# Patient Record
Sex: Female | Born: 1985 | Race: White | Hispanic: No | Marital: Married | Smoking: Never smoker
Health system: Southern US, Community
[De-identification: ages and names within clinical notes are randomized; demographics above are authoritative.]

## PROBLEM LIST (undated history)

## (undated) ENCOUNTER — Inpatient Hospital Stay (HOSPITAL_COMMUNITY): Payer: Self-pay

## (undated) DIAGNOSIS — J45909 Unspecified asthma, uncomplicated: Secondary | ICD-10-CM

## (undated) DIAGNOSIS — R002 Palpitations: Secondary | ICD-10-CM

## (undated) DIAGNOSIS — J40 Bronchitis, not specified as acute or chronic: Secondary | ICD-10-CM

## (undated) DIAGNOSIS — R011 Cardiac murmur, unspecified: Secondary | ICD-10-CM

## (undated) DIAGNOSIS — Z8759 Personal history of other complications of pregnancy, childbirth and the puerperium: Secondary | ICD-10-CM

## (undated) DIAGNOSIS — Z8669 Personal history of other diseases of the nervous system and sense organs: Secondary | ICD-10-CM

## (undated) DIAGNOSIS — E785 Hyperlipidemia, unspecified: Secondary | ICD-10-CM

## (undated) DIAGNOSIS — T7840XA Allergy, unspecified, initial encounter: Secondary | ICD-10-CM

## (undated) DIAGNOSIS — O139 Gestational [pregnancy-induced] hypertension without significant proteinuria, unspecified trimester: Secondary | ICD-10-CM

## (undated) HISTORY — DX: Allergy, unspecified, initial encounter: T78.40XA

## (undated) HISTORY — PX: MOUTH SURGERY: SHX715

## (undated) HISTORY — DX: Personal history of other complications of pregnancy, childbirth and the puerperium: Z87.59

## (undated) HISTORY — DX: Cardiac murmur, unspecified: R01.1

## (undated) HISTORY — DX: Palpitations: R00.2

## (undated) HISTORY — DX: Unspecified asthma, uncomplicated: J45.909

## (undated) HISTORY — PX: OTHER SURGICAL HISTORY: SHX169

## (undated) HISTORY — DX: Personal history of other diseases of the nervous system and sense organs: Z86.69

## (undated) HISTORY — DX: Hyperlipidemia, unspecified: E78.5

---
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
## 2004-10-15 ENCOUNTER — Other Ambulatory Visit: Admission: RE | Admit: 2004-10-15 | Discharge: 2004-10-15 | Payer: Self-pay | Admitting: Obstetrics & Gynecology

## 2004-10-16 ENCOUNTER — Other Ambulatory Visit: Admission: RE | Admit: 2004-10-16 | Discharge: 2004-10-16 | Payer: Self-pay | Admitting: Obstetrics & Gynecology

## 2005-04-26 ENCOUNTER — Emergency Department (HOSPITAL_COMMUNITY): Admission: EM | Admit: 2005-04-26 | Discharge: 2005-04-26 | Payer: Self-pay | Admitting: Emergency Medicine

## 2007-12-01 ENCOUNTER — Ambulatory Visit: Payer: Self-pay | Admitting: Internal Medicine

## 2010-08-27 LAB — ANTIBODY SCREEN: Antibody Screen: NEGATIVE

## 2010-08-27 LAB — HEPATITIS B SURFACE ANTIGEN: Hepatitis B Surface Ag: NEGATIVE

## 2010-08-27 LAB — GC/CHLAMYDIA PROBE AMP, GENITAL
Chlamydia: NEGATIVE
Gonorrhea: NEGATIVE

## 2010-10-03 ENCOUNTER — Encounter: Payer: Self-pay | Admitting: *Deleted

## 2010-10-03 ENCOUNTER — Encounter: Payer: Self-pay | Admitting: Cardiology

## 2010-10-04 ENCOUNTER — Ambulatory Visit (INDEPENDENT_AMBULATORY_CARE_PROVIDER_SITE_OTHER): Payer: PRIVATE HEALTH INSURANCE | Admitting: Cardiology

## 2010-10-04 ENCOUNTER — Encounter: Payer: Self-pay | Admitting: Cardiology

## 2010-10-04 VITALS — BP 98/70 | HR 92 | Ht 62.0 in | Wt 152.0 lb

## 2010-10-04 DIAGNOSIS — R002 Palpitations: Secondary | ICD-10-CM | POA: Insufficient documentation

## 2010-10-04 NOTE — Progress Notes (Signed)
HPI Kristin Sellers is a delightful 25 year old married white female, who is in her first trimester of her first pregnancy, who was referred by Dr. Annamaria Helling for palpitations.  She has a spontaneously. They started about 6 weeks ago. She denies any other symptoms other than telling that her heart is racing. She is checked this on oximetry on her nursing floor. Her heart rate was running about 130.  She had a history of palpitations back in 2010. At that time she was evaluated by Dr. Kirke Corin who performed an echocardiogram which was normal and also a Holter monitor that showed sinus arrhythmia and sometimes a resting tachycardia. There was no ectopy.  He was placed on low-dose metoprolol which made her feel very tired, dropped her blood pressure but improved her palpitations.  Patient not made worse by exercise. She's had no presyncope or syncope. She has eliminated caffeine since becoming pregnant. She is not using over-the-counter decongestants or other supplements. She is on prenatal vitamins and Zofran.  EKG today shows normal sinus rhythm with a borderline short PR interval 120 ms. Past Medical History  Diagnosis Date  . Palpitations   . Hyperlipidemia     Past Surgical History  Procedure Date  . None     Family History  Problem Relation Age of Onset  . Coronary artery disease    . Hypertension    . Heart attack      History   Social History  . Marital Status: Single    Spouse Name: N/A    Number of Children: 1  . Years of Education: N/A   Occupational History  . RN    Social History Main Topics  . Smoking status: Never Smoker   . Smokeless tobacco: Not on file  . Alcohol Use: Yes     occasional glass of wine when not pregnant  . Drug Use: No  . Sexually Active: Not on file   Other Topics Concern  . Not on file   Social History Narrative  . No narrative on file    Allergies  Allergen Reactions  . Sulfa Antibiotics     Current Outpatient Prescriptions    Medication Sig Dispense Refill  . ondansetron (ZOFRAN) 4 MG tablet Take 4 mg by mouth every 8 (eight) hours as needed.        . Prenatal Vit-Fe Psac Cmplx-FA (PRENATAL MULTIVITAMIN) 60-1 MG tablet Take 1 tablet by mouth daily with breakfast.          ROS Negative other than HPI.   PE General Appearance: well developed, well nourished in no acute distress HEENT: symmetrical face, PERRLA, good dentition  Neck: no JVD, thyromegaly, or adenopathy, trachea midline Chest: symmetric without deformity Cardiac: PMI non-displaced, RRR, normal S1, S2, no gallop or murmur Lung: clear to ausculation and percussion Vascular: all pulses full without bruits  Abdominal: nondistended, nontender, good bowel sounds, no HSM, no bruits, gravida,Extremities: no cyanosis, clubbing or edema, no sign of DVT, no varicosities  Skin: normal color, no rashes Neuro: alert and oriented x 3, non-focal Pysch: normal affect Filed Vitals:   10/04/10 1522  BP: 98/70  Pulse: 92  Height: 5\' 2"  (1.575 m)  Weight: 152 lb (68.947 kg)    EKG  Labs and Studies Reviewed.   No results found for this basename: WBC, HGB, HCT, MCV, PLT      Chemistry   No results found for this basename: NA, K, CL, CO2, BUN, CREATININE, GLU   No results found for  this basename: CALCIUM, ALKPHOS, AST, ALT, BILITOT       No results found for this basename: CHOL   No results found for this basename: HDL   No results found for this basename: LDLCALC   No results found for this basename: TRIG   No results found for this basename: CHOLHDL   No results found for this basename: HGBA1C   No results found for this basename: ALT, AST, GGT, ALKPHOS, BILITOT   No results found for this basename: TSH

## 2010-10-04 NOTE — Patient Instructions (Signed)
Your physician recommends that you schedule a follow-up appointment as needed with Dr. Wall   

## 2011-02-18 NOTE — L&D Delivery Note (Signed)
Patient was C/C/+2 and pushed for 1hr 50 minutes with epidural.   NSVD  female infant, Apgars 9/9, weight 7#11.   The patient had an extensive 2nd degree laceration extending up the R labia repaired with 2-0 vicryl, sphincter intact. Fundus was firm. EBL was expected, approx 400. Placenta was delivered intact, sent to pathology given abx tx for suspected chorioamnionitis. Vagina was clear.  Baby was vigorous to bedside.  Philip Aspen

## 2011-02-20 ENCOUNTER — Inpatient Hospital Stay (HOSPITAL_COMMUNITY)
Admission: AD | Admit: 2011-02-20 | Discharge: 2011-02-20 | Disposition: A | Discharge status: Home / Self Care | Payer: PRIVATE HEALTH INSURANCE | Source: Ambulatory Visit | Attending: Obstetrics and Gynecology | Admitting: Obstetrics and Gynecology

## 2011-02-20 ENCOUNTER — Encounter (HOSPITAL_COMMUNITY): Payer: Self-pay | Admitting: *Deleted

## 2011-02-20 DIAGNOSIS — O479 False labor, unspecified: Secondary | ICD-10-CM

## 2011-02-20 DIAGNOSIS — O4700 False labor before 37 completed weeks of gestation, unspecified trimester: Secondary | ICD-10-CM | POA: Insufficient documentation

## 2011-02-20 LAB — URINALYSIS, ROUTINE W REFLEX MICROSCOPIC
Bilirubin Urine: NEGATIVE
Glucose, UA: NEGATIVE mg/dL
Hgb urine dipstick: NEGATIVE
Ketones, ur: NEGATIVE mg/dL
Leukocytes, UA: NEGATIVE
Nitrite: NEGATIVE
Protein, ur: NEGATIVE mg/dL
Specific Gravity, Urine: <1.005 — ABNORMAL LOW (ref 1.005–1.030)
Urobilinogen, UA: 0.2 mg/dL (ref 0.0–1.0)
pH: 6 (ref 5.0–8.0)

## 2011-02-20 MED ORDER — NIFEDIPINE 10 MG PO CAPS
10.0000 mg | ORAL_CAPSULE | Freq: Once | ORAL | Status: AC
  Administered 2011-02-20: 10 mg via ORAL
  Filled 2011-02-20: qty 1

## 2011-02-20 NOTE — Progress Notes (Signed)
VE done by Dr. Tenny Craw. Vaginal ultrasound also done. Cervical length measured. 4.3 as per Dr Tenny Craw.

## 2011-02-20 NOTE — Progress Notes (Signed)
CC:  Contractions HPi: 26 yo G1P0 @ 32+ weeks who presents with 24 hours of contractions.  Pt has noticed a steady increase in contractions over the last 48 hours.  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  Denies VB, LOF, dysuria. Notes active FM.   O: AOx3, NAD Gravid soft, NT/ND NEFG Vagina rugated, no pool, visually closed cvx closed, soft, 50% effaced toco q3-5 FHT 140 reactive with accels no decels  Bedside TVUS: Cervical length 3.9-4.3cm, no funnel, no change with valsalva  A/P 1) Preterm contractions without evidence of preterm labor.  Will check UA, po hydrate.  One dose of oral procardia for symptomatic relief.  FFN collected but will be discarded.

## 2011-03-12 LAB — STREP B DNA PROBE: GBS: NEGATIVE

## 2011-04-14 ENCOUNTER — Encounter (HOSPITAL_COMMUNITY): Payer: Self-pay | Admitting: *Deleted

## 2011-04-14 ENCOUNTER — Encounter (HOSPITAL_COMMUNITY): Payer: Self-pay | Admitting: Anesthesiology

## 2011-04-14 ENCOUNTER — Inpatient Hospital Stay (HOSPITAL_COMMUNITY): Payer: PRIVATE HEALTH INSURANCE | Admitting: Anesthesiology

## 2011-04-14 ENCOUNTER — Inpatient Hospital Stay (HOSPITAL_COMMUNITY)
Admission: AD | Admit: 2011-04-14 | Discharge: 2011-04-16 | DRG: 775 | Disposition: A | Discharge status: Home / Self Care | Payer: PRIVATE HEALTH INSURANCE | Attending: Obstetrics & Gynecology | Admitting: Obstetrics & Gynecology

## 2011-04-14 DIAGNOSIS — O139 Gestational [pregnancy-induced] hypertension without significant proteinuria, unspecified trimester: Principal | ICD-10-CM | POA: Diagnosis present

## 2011-04-14 DIAGNOSIS — Z3480 Encounter for supervision of other normal pregnancy, unspecified trimester: Secondary | ICD-10-CM

## 2011-04-14 DIAGNOSIS — O411090 Infection of amniotic sac and membranes, unspecified, unspecified trimester, not applicable or unspecified: Secondary | ICD-10-CM | POA: Diagnosis present

## 2011-04-14 LAB — URINALYSIS, ROUTINE W REFLEX MICROSCOPIC
Bilirubin Urine: NEGATIVE
Glucose, UA: NEGATIVE mg/dL
Ketones, ur: NEGATIVE mg/dL
Specific Gravity, Urine: 1.015 (ref 1.005–1.030)
pH: 6 (ref 5.0–8.0)

## 2011-04-14 LAB — CBC
HCT: 39.9 % (ref 36.0–46.0)
Hemoglobin: 13.6 g/dL (ref 12.0–15.0)
MCH: 32.1 pg (ref 26.0–34.0)
MCHC: 34.1 g/dL (ref 30.0–36.0)
MCV: 94.1 fL (ref 78.0–100.0)
RBC: 4.24 MIL/uL (ref 3.87–5.11)

## 2011-04-14 LAB — COMPREHENSIVE METABOLIC PANEL
ALT: 12 U/L (ref 0–35)
BUN: 11 mg/dL (ref 6–23)
CO2: 20 mEq/L (ref 19–32)
Calcium: 9.1 mg/dL (ref 8.4–10.5)
GFR calc Af Amer: >90 mL/min (ref >90)
GFR calc non Af Amer: >90 mL/min (ref >90)
Glucose, Bld: 86 mg/dL (ref 70–99)
Sodium: 133 mEq/L — ABNORMAL LOW (ref 135–145)
Total Protein: 6.6 g/dL (ref 6.0–8.3)

## 2011-04-14 LAB — RPR: RPR Ser Ql: NONREACTIVE

## 2011-04-14 LAB — URINE MICROSCOPIC-ADD ON

## 2011-04-14 LAB — LACTATE DEHYDROGENASE: LDH: 231 U/L (ref 94–250)

## 2011-04-14 MED ORDER — ONDANSETRON HCL 4 MG/2ML IJ SOLN: 4.0000 mg | Freq: Four times a day (QID) | INTRAMUSCULAR | Status: DC | PRN

## 2011-04-14 MED ORDER — CITRIC ACID-SODIUM CITRATE 334-500 MG/5ML PO SOLN: 30.0000 mL | ORAL | Status: DC | PRN

## 2011-04-14 MED ORDER — DIPHENHYDRAMINE HCL 50 MG/ML IJ SOLN: 12.5000 mg | INTRAMUSCULAR | Status: DC | PRN

## 2011-04-14 MED ORDER — SODIUM BICARBONATE 8.4 % IV SOLN
INTRAVENOUS | Status: DC | PRN
  Administered 2011-04-14: 4 mL via EPIDURAL

## 2011-04-14 MED ORDER — ACETAMINOPHEN 325 MG PO TABS
650.0000 mg | ORAL_TABLET | ORAL | Status: DC | PRN
  Administered 2011-04-14: 650 mg via ORAL
  Filled 2011-04-14: qty 2

## 2011-04-14 MED ORDER — IBUPROFEN 600 MG PO TABS: 600.0000 mg | ORAL_TABLET | Freq: Four times a day (QID) | ORAL | Status: DC | PRN

## 2011-04-14 MED ORDER — SODIUM CHLORIDE 0.9 % IV SOLN
1.0000 g | Freq: Four times a day (QID) | INTRAVENOUS | Status: DC
  Administered 2011-04-14 – 2011-04-15 (×2): 1 g via INTRAVENOUS
  Filled 2011-04-14 (×6): qty 1000

## 2011-04-14 MED ORDER — GENTAMICIN SULFATE 40 MG/ML IJ SOLN
130.0000 mg | Freq: Three times a day (TID) | INTRAMUSCULAR | Status: DC
  Administered 2011-04-14 – 2011-04-15 (×2): 130 mg via INTRAVENOUS
  Filled 2011-04-14 (×3): qty 3.25

## 2011-04-14 MED ORDER — FLEET ENEMA 7-19 GM/118ML RE ENEM: 1.0000 | ENEMA | RECTAL | Status: DC | PRN

## 2011-04-14 MED ORDER — FENTANYL 2.5 MCG/ML BUPIVACAINE 1/10 % EPIDURAL INFUSION (WH - ANES)
14.0000 mL/h | INTRAMUSCULAR | Status: DC
  Administered 2011-04-14 (×3): 14 mL/h via EPIDURAL
  Administered 2011-04-14: 7 mL/h via EPIDURAL
  Filled 2011-04-14 (×4): qty 60

## 2011-04-14 MED ORDER — LIDOCAINE HCL (PF) 1 % IJ SOLN
30.0000 mL | INTRAMUSCULAR | Status: DC | PRN
  Administered 2011-04-15: 30 mL via SUBCUTANEOUS
  Filled 2011-04-14: qty 30

## 2011-04-14 MED ORDER — LACTATED RINGERS IV SOLN
500.0000 mL | INTRAVENOUS | Status: DC | PRN
Start: 2011-04-14 — End: 2011-04-15
  Administered 2011-04-14 (×3): 500 mL via INTRAVENOUS

## 2011-04-14 MED ORDER — EPHEDRINE 5 MG/ML INJ
10.0000 mg | INTRAVENOUS | Status: DC | PRN
  Filled 2011-04-14: qty 4

## 2011-04-14 MED ORDER — BUTORPHANOL TARTRATE 2 MG/ML IJ SOLN
1.0000 mg | INTRAMUSCULAR | Status: DC | PRN
  Administered 2011-04-14: 1 mg via INTRAVENOUS
  Filled 2011-04-14: qty 1

## 2011-04-14 MED ORDER — OXYTOCIN BOLUS FROM INFUSION
500.0000 mL | Freq: Once | INTRAVENOUS | Status: AC
  Administered 2011-04-15: 500 mL via INTRAVENOUS
  Filled 2011-04-14: qty 1000
  Filled 2011-04-14: qty 500

## 2011-04-14 MED ORDER — EPHEDRINE 5 MG/ML INJ: 10.0000 mg | INTRAVENOUS | Status: DC | PRN

## 2011-04-14 MED ORDER — PHENYLEPHRINE 40 MCG/ML (10ML) SYRINGE FOR IV PUSH (FOR BLOOD PRESSURE SUPPORT)
80.0000 ug | PREFILLED_SYRINGE | INTRAVENOUS | Status: DC | PRN
  Filled 2011-04-14: qty 5

## 2011-04-14 MED ORDER — FENTANYL 2.5 MCG/ML BUPIVACAINE 1/10 % EPIDURAL INFUSION (WH - ANES)
INTRAMUSCULAR | Status: DC | PRN
  Administered 2011-04-14: 13 mL/h via EPIDURAL

## 2011-04-14 MED ORDER — OXYTOCIN 20 UNITS IN LACTATED RINGERS INFUSION - SIMPLE
1.0000 m[IU]/min | INTRAVENOUS | Status: DC
  Administered 2011-04-14: 2 m[IU]/min via INTRAVENOUS
  Administered 2011-04-14: 10 m[IU]/min via INTRAVENOUS

## 2011-04-14 MED ORDER — LACTATED RINGERS IV SOLN: 500.0000 mL | Freq: Once | INTRAVENOUS | Status: DC

## 2011-04-14 MED ORDER — OXYCODONE-ACETAMINOPHEN 5-325 MG PO TABS: 1.0000 | ORAL_TABLET | ORAL | Status: DC | PRN

## 2011-04-14 MED ORDER — OXYTOCIN 20 UNITS IN LACTATED RINGERS INFUSION - SIMPLE: 125.0000 mL/h | Freq: Once | INTRAVENOUS | Status: DC

## 2011-04-14 MED ORDER — TERBUTALINE SULFATE 1 MG/ML IJ SOLN: 0.2500 mg | Freq: Once | INTRAMUSCULAR | Status: AC | PRN

## 2011-04-14 MED ORDER — PHENYLEPHRINE 40 MCG/ML (10ML) SYRINGE FOR IV PUSH (FOR BLOOD PRESSURE SUPPORT): 80.0000 ug | PREFILLED_SYRINGE | INTRAVENOUS | Status: DC | PRN

## 2011-04-14 MED ORDER — LACTATED RINGERS IV SOLN
INTRAVENOUS | Status: DC
  Administered 2011-04-14: 04:00:00 via INTRAVENOUS
  Administered 2011-04-14: 125 mL/h via INTRAVENOUS
  Administered 2011-04-14: 21:00:00 via INTRAVENOUS

## 2011-04-14 NOTE — Progress Notes (Signed)
Pt reports uc's q 2-5 min since 2245 along w/ some bloody show.

## 2011-04-14 NOTE — Progress Notes (Signed)
04/14/11 2143  Provider Notification  Provider Name/Title Philip Aspen, DO  Method of Notification Phone  Pt not feeling the urge to push, provider notified. Want epidural to be lowered and pt to labor down

## 2011-04-14 NOTE — Anesthesia Preprocedure Evaluation (Signed)
Anesthesia Evaluation  Patient identified by MRN, date of birth, ID band Patient awake    Reviewed: Allergy & Precautions, H&P , Patient's Chart, lab work & pertinent test results  Airway Mallampati: II TM Distance: >3 FB Neck ROM: full    Dental  (+) Teeth Intact   Pulmonary  clear to auscultation        Cardiovascular regular Normal    Neuro/Psych    GI/Hepatic   Endo/Other    Renal/GU      Musculoskeletal   Abdominal   Peds  Hematology   Anesthesia Other Findings       Reproductive/Obstetrics (+) Pregnancy                           Anesthesia Physical Anesthesia Plan  ASA: III  Anesthesia Plan: Epidural   Post-op Pain Management:    Induction:   Airway Management Planned:   Additional Equipment:   Intra-op Plan:   Post-operative Plan:   Informed Consent: I have reviewed the patients History and Physical, chart, labs and discussed the procedure including the risks, benefits and alternatives for the proposed anesthesia with the patient or authorized representative who has indicated his/her understanding and acceptance.   Dental Advisory Given  Plan Discussed with:   Anesthesia Plan Comments: (Labs checked- platelets confirmed with RN in room. Fetal heart tracing, per RN, reported to be stable enough for sitting procedure. Discussed epidural, and patient consents to the procedure:  included risk of possible headache,backache, failed block, allergic reaction, and nerve injury. This patient was asked if she had any questions or concerns before the procedure started. )        Anesthesia Quick Evaluation  

## 2011-04-14 NOTE — Anesthesia Procedure Notes (Addendum)
Epidural Patient location during procedure: OB  Preanesthetic Checklist Completed: patient identified, site marked, surgical consent, pre-op evaluation, timeout performed, IV checked, risks and benefits discussed and monitors and equipment checked  Epidural Patient position: sitting Prep: site prepped and draped and DuraPrep Patient monitoring: continuous pulse ox and blood pressure Approach: midline Injection technique: LOR air  Needle:  Needle type: Tuohy  Needle gauge: 17 G Needle length: 9 cm Needle insertion depth: 6 cm Catheter type: closed end flexible Catheter size: 19 Gauge Catheter at skin depth: 12 cm Test dose: negative  Assessment Events: blood not aspirated, injection not painful, no injection resistance, negative IV test and no paresthesia  Additional Notes Dosing of Epidural:  1st dose, through catheter ............................................[name] epi 1:200K + Xylocaine 40 mg  2nd dose, through catheter, after waiting 3 minutes...Marland KitchenMarland Kitchenepi 1:200K + Xylocaine 40 mg  3rd dose, through catheter after waiting 3 minutes .............................Marcaine   4mg    ( mg Marcaine are expressed as equivilent  cc's medication removed from the 0.1%Bupiv / fentanyl syringe from L&D pump)  ( 2% Xylo charted as a single dose in Epic Meds for ease of charting; actual dosing was fractionated as above, for saftey's sake)  As each dose occurred, patient was free of IV sx; and patient exhibited no evidence of SA injection.  Patient is more comfortable after epidural dosed. Please see RN's note for documentation of vital signs,and FHR which are stable.

## 2011-04-14 NOTE — H&P (Signed)
26 y.o. G2P0010  Estimated Date of Delivery: 04/15/11 admitted at 39/[redacted] weeks gestation admitted in early labor with borderline PIH.[name] Prenatal course was uncomplicated. Prenatal labs: Blood Type:O+.  Screening tests for HIV, Syphilis, Hepatitis B, Rubella sensitivity, gestational diabetes, and perineal group B strep colonization were negative.  Patient declined fetal screens.  Afebrile, VSS Heart and Lungs: No active disease Abdomen: soft, gravid, EFW AGA. Cervical exam:  1/80, Vtx -1  Impression: Latent phase labor, term pregnancy, elevated blood pressures  Plan:  Admit for IV pitocin labor augmentation.  If no significant cervical change occurs and blood pressures normalize can consider discharge home in AM.

## 2011-04-14 NOTE — Progress Notes (Signed)
Regular contractions tonight, some spotting.

## 2011-04-14 NOTE — Progress Notes (Signed)
Pt comfortable s/p epidural FHT 125 mod var, + accels, rare variable TOCO q3 SVE 4/80/-1 AROM clear Continue pitocin Cont other routine care

## 2011-04-14 NOTE — Progress Notes (Signed)
Pt  Uncomfortable with contractions, desiring epidural. FHT: 130 mod var no current accels or decels TOCO: q2-4 SVE: 3-4/80/-2 per RN Continue pitocin Epidural placed moments ago At next check will consider AROM if head well applied.

## 2011-04-15 ENCOUNTER — Encounter (HOSPITAL_COMMUNITY): Payer: Self-pay | Admitting: *Deleted

## 2011-04-15 LAB — ABO/RH: ABO/RH(D): O POS

## 2011-04-15 MED ORDER — LACTATED RINGERS IV SOLN: INTRAVENOUS | Status: DC

## 2011-04-15 MED ORDER — BENZOCAINE-MENTHOL 20-0.5 % EX AERO: 1.0000 "application " | INHALATION_SPRAY | CUTANEOUS | Status: DC | PRN

## 2011-04-15 MED ORDER — SIMETHICONE 80 MG PO CHEW: 80.0000 mg | CHEWABLE_TABLET | ORAL | Status: DC | PRN

## 2011-04-15 MED ORDER — SENNOSIDES-DOCUSATE SODIUM 8.6-50 MG PO TABS
2.0000 | ORAL_TABLET | Freq: Every day | ORAL | Status: DC
  Administered 2011-04-15: 2 via ORAL

## 2011-04-15 MED ORDER — DIPHENHYDRAMINE HCL 25 MG PO CAPS: 25.0000 mg | ORAL_CAPSULE | Freq: Four times a day (QID) | ORAL | Status: DC | PRN

## 2011-04-15 MED ORDER — ONDANSETRON HCL 4 MG/2ML IJ SOLN: 4.0000 mg | INTRAMUSCULAR | Status: DC | PRN

## 2011-04-15 MED ORDER — PRENATAL MULTIVITAMIN CH
1.0000 | ORAL_TABLET | Freq: Every day | ORAL | Status: DC
  Administered 2011-04-15: 1 via ORAL
  Filled 2011-04-15: qty 1

## 2011-04-15 MED ORDER — WITCH HAZEL-GLYCERIN EX PADS: 1.0000 "application " | MEDICATED_PAD | CUTANEOUS | Status: DC | PRN

## 2011-04-15 MED ORDER — OXYCODONE-ACETAMINOPHEN 5-325 MG PO TABS: 1.0000 | ORAL_TABLET | ORAL | Status: DC | PRN

## 2011-04-15 MED ORDER — IBUPROFEN 600 MG PO TABS
600.0000 mg | ORAL_TABLET | Freq: Four times a day (QID) | ORAL | Status: DC
  Administered 2011-04-15 – 2011-04-16 (×5): 600 mg via ORAL
  Filled 2011-04-15 (×6): qty 1

## 2011-04-15 MED ORDER — SODIUM CHLORIDE 0.9 % IV SOLN
2.0000 g | Freq: Once | INTRAVENOUS | Status: DC
  Filled 2011-04-15: qty 2000

## 2011-04-15 MED ORDER — GENTAMICIN SULFATE 40 MG/ML IJ SOLN
130.0000 mg | Freq: Once | INTRAVENOUS | Status: DC
  Filled 2011-04-15: qty 3.25

## 2011-04-15 MED ORDER — DIBUCAINE 1 % RE OINT: 1.0000 "application " | TOPICAL_OINTMENT | RECTAL | Status: DC | PRN

## 2011-04-15 MED ORDER — ZOLPIDEM TARTRATE 5 MG PO TABS: 5.0000 mg | ORAL_TABLET | Freq: Every evening | ORAL | Status: DC | PRN

## 2011-04-15 MED ORDER — LANOLIN HYDROUS EX OINT: TOPICAL_OINTMENT | CUTANEOUS | Status: DC | PRN

## 2011-04-15 MED ORDER — ONDANSETRON HCL 4 MG PO TABS: 4.0000 mg | ORAL_TABLET | ORAL | Status: DC | PRN

## 2011-04-15 MED ORDER — TETANUS-DIPHTH-ACELL PERTUSSIS 5-2.5-18.5 LF-MCG/0.5 IM SUSP: 0.5000 mL | Freq: Once | INTRAMUSCULAR | Status: DC

## 2011-04-15 NOTE — Progress Notes (Signed)
Post Partum Day 0.5 Subjective: no complaints  Objective: Blood pressure 105/71, pulse 90, temperature 97.5 F Breastfeeding  Physical Exam:  General: alert Lochia: appropriate Uterine Fundus: firm   Basename 04/14/11 0115  HGB 13.6  HCT 39.9    Assessment/Plan: Plan for early discharge tomorrow if doing well.  LOS: 1 day   Jalea Bronaugh D 04/15/2011, 9:23 AM

## 2011-04-15 NOTE — Anesthesia Postprocedure Evaluation (Signed)
Anesthesia Post-op Note  Patient: Kristin Sellers  Procedure(s) Performed: * No procedures listed *  Patient Location: 133  Anesthesia Type: Epidural  Level of Consciousness: awake, alert  and oriented  Airway and Oxygen Therapy: Patient Spontanous Breathing  Post-op Pain: none  Post-op Assessment: Post-op Vital signs reviewed, Patient's Cardiovascular Status Stable, No headache, No backache, No residual numbness and No residual motor weakness  Post-op Vital Signs: Reviewed and stable  Complications: No apparent anesthesia complications

## 2011-04-16 LAB — CBC
HCT: 34.2 % — ABNORMAL LOW (ref 36.0–46.0)
Platelets: 200 10*3/uL (ref 150–400)
RDW: 14.1 % (ref 11.5–15.5)
WBC: 13.2 10*3/uL — ABNORMAL HIGH (ref 4.0–10.5)

## 2011-04-16 NOTE — Discharge Summary (Signed)
Discharge diagnoses  #1-term pregnancy delivered 7 lbs. 11 oz. Female infant Apgars 9 and 9  #2-blood type O-positive  #3-borderline pregnancy-induced hypertension  Procedures-Pitocin augmentation and subsequent normal spontaneous delivery and second-degree tear with extension into right labia, repaired  Summary-this 26 year old gravida 2 now para 1 was admitted at term in latent phase labor with suspected borderline hypertension and was augmented with Pitocin and subsequently had a normal spontaneous delivery of a 7 lbs. 11 oz. Female infant with Apgars of 9 and 9 over second-degree tear with extension into the right labia which was repaired without difficulty.  Her postpartum course was uncomplicated. Her CBC, postpartum was 11.3/13.2 with 200,000 platelets. On the morning of 2/27 she was ambulating well tolerating a regular diet well breast-feeding without difficulty and was desirous of discharge. Accordingly she was given all appropriate instructions for discharge for sure and understood all structures well. Discharge medications include vitamins-1 a day as long as she is breast-feeding and she will use Advil 2 every 4 hours or 3 every 6 hours as needed for discomfort. Follow up in the office we carried out in approximately 4 weeks' time.

## 2011-05-13 ENCOUNTER — Other Ambulatory Visit: Payer: Self-pay

## 2011-09-02 ENCOUNTER — Other Ambulatory Visit: Payer: Self-pay | Admitting: Physician Assistant

## 2011-09-09 LAB — OB RESULTS CONSOLE GC/CHLAMYDIA: Chlamydia: NEGATIVE

## 2011-09-09 LAB — OB RESULTS CONSOLE RPR: RPR: NONREACTIVE

## 2012-02-16 ENCOUNTER — Inpatient Hospital Stay (HOSPITAL_COMMUNITY)
Admission: AD | Admit: 2012-02-16 | Discharge: 2012-02-16 | Disposition: A | Discharge status: Home / Self Care | Payer: PRIVATE HEALTH INSURANCE | Source: Ambulatory Visit | Attending: Obstetrics and Gynecology | Admitting: Obstetrics and Gynecology

## 2012-02-16 ENCOUNTER — Encounter (HOSPITAL_COMMUNITY): Payer: Self-pay

## 2012-02-16 DIAGNOSIS — R109 Unspecified abdominal pain: Secondary | ICD-10-CM | POA: Insufficient documentation

## 2012-02-16 DIAGNOSIS — N949 Unspecified condition associated with female genital organs and menstrual cycle: Secondary | ICD-10-CM | POA: Insufficient documentation

## 2012-02-16 DIAGNOSIS — O4700 False labor before 37 completed weeks of gestation, unspecified trimester: Secondary | ICD-10-CM | POA: Insufficient documentation

## 2012-02-16 LAB — URINALYSIS, ROUTINE W REFLEX MICROSCOPIC
Nitrite: NEGATIVE
Specific Gravity, Urine: >1.03 — ABNORMAL HIGH (ref 1.005–1.030)
Urobilinogen, UA: 0.2 mg/dL (ref 0.0–1.0)

## 2012-02-16 LAB — WET PREP, GENITAL: Yeast Wet Prep HPF POC: NONE SEEN

## 2012-02-16 LAB — FETAL FIBRONECTIN: Fetal Fibronectin: NEGATIVE

## 2012-02-16 NOTE — MAU Note (Signed)
Patient states she started having pelvic pressure yesterday. Today has had increasing cramping. Has an increase in her normal vaginal discharge. States the baby is moving but not as much as usual. Denies bleeding or leaking.

## 2012-02-16 NOTE — MAU Provider Note (Signed)
Chief Complaint:  Abdominal Cramping and Vaginal Discharge   First Provider Initiated Contact with Patient 02/16/12 1750      HPI: Kristin Sellers is a 26 y.o. G2P1001 at [redacted]w[redacted]d who presents to maternity admissions reporting intermittent cramping, increase in vaginal discharge, and constant pelvic pressure when standing today.  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  She reports good fetal movement, denies LOF, vaginal bleeding, vaginal itching/burning, urinary symptoms, h/a, dizziness, n/v, or fever/chills.     Past Medical History: Past Medical History  Diagnosis Date  . Palpitations   . Hyperlipidemia     Past obstetric history: OB History    Grav Para Term Preterm Abortions TAB SAB Ect Mult Living   2 1 1  0 0 0 0 0 0 1     # Outc Date GA Lbr Len/2nd Wgt Sex Del Anes PTL Lv   1 TRM 2/13 [redacted]w[redacted]d 20:24 / 04:13 7lb11.5oz(3.501kg) F SVD EPI  Yes   Comments: wnl   2 CUR               Past Surgical History: Past Surgical History  Procedure Date  . None   . Mouth surgery   . Pediatric  surgery     Family History: Family History  Problem Relation Age of Onset  . Coronary artery disease    . Hypertension    . Heart attack    . Heart disease Mother   . Hyperlipidemia Mother   . Heart disease Father   . Hyperlipidemia Father   . Hypertension Father   . Anesthesia problems Neg Hx   . Hypotension Neg Hx   . Malignant hyperthermia Neg Hx   . Pseudochol deficiency Neg Hx     Social History: History  Substance Use Topics  . Smoking status: Never Smoker   . Smokeless tobacco: Not on file  . Alcohol Use: No     Comment: occasional glass of wine when not pregnant    Allergies:  Allergies  Allergen Reactions  . Sulfa Antibiotics Hives    Meds:  Prescriptions prior to admission  Medication Sig Dispense Refill  . ondansetron (ZOFRAN) 8 MG tablet Take by mouth every 8 (eight) hours as needed. nausea      . Prenatal Vit-Fe Fumarate-FA (PRENATAL  MULTIVITAMIN) TABS Take 1 tablet by mouth daily.        ROS: Pertinent findings in history of present illness.  Physical Exam  Blood pressure 112/81, pulse 111, temperature 98.3 F (36.8 C), temperature source Oral, resp. rate 16, height 5\' 2"  (1.575 m), weight 74.118 kg (163 lb 6.4 oz), SpO2 100.00%. GENERAL: Well-developed, well-nourished female in no acute distress.  HEENT: normocephalic HEART: normal rate RESP: normal effort ABDOMEN: Soft, non-tender, gravid appropriate for gestational age EXTREMITIES: Nontender, no edema NEURO: alert and oriented SPECULUM EXAM: NEFG, physiologic discharge, no blood, cervix clean Dilation: Closed Effacement (%): Thick Cervical Position: Posterior Exam by:: L Leftwich-Kirby CNM  FHT:  Baseline 135 , moderate variability, accelerations present, no decelerations Contractions: irregular, period of ctx q 4 mins, then 10 minute pause without ctx   Labs: Results for orders placed during the hospital encounter of 02/16/12 (from the past 24 hour(s))  URINALYSIS, ROUTINE W REFLEX MICROSCOPIC     Status: Abnormal   Collection Time   02/16/12  3:20 PM      Component Value Range   Color, Urine YELLOW  YELLOW   APPearance CLEAR  CLEAR   Specific Gravity,  Urine >1.030 (*) 1.005 - 1.030   pH 6.0  5.0 - 8.0   Glucose, UA NEGATIVE  NEGATIVE mg/dL   Hgb urine dipstick NEGATIVE  NEGATIVE   Bilirubin Urine NEGATIVE  NEGATIVE   Ketones, ur NEGATIVE  NEGATIVE mg/dL   Protein, ur NEGATIVE  NEGATIVE mg/dL   Urobilinogen, UA 0.2  0.0 - 1.0 mg/dL   Nitrite NEGATIVE  NEGATIVE   Leukocytes, UA NEGATIVE  NEGATIVE  FETAL FIBRONECTIN     Status: Normal   Collection Time   02/16/12  5:55 PM      Component Value Range   Fetal Fibronectin NEGATIVE  NEGATIVE  WET PREP, GENITAL     Status: Abnormal   Collection Time   02/16/12  5:55 PM      Component Value Range   Yeast Wet Prep HPF POC NONE SEEN  NONE SEEN   Trich, Wet Prep NONE SEEN  NONE SEEN   Clue Cells Wet  Prep HPF POC NONE SEEN  NONE SEEN   WBC, Wet Prep HPF POC FEW (*) NONE SEEN   Assessment: 1. Threatened preterm labor     Plan: Discharge home PTL precautions and fetal kick counts Drink plenty of water F/U in office Return to MAU as needed    Medication List     As of 02/16/2012  6:10 PM    ASK your doctor about these medications         ondansetron 8 MG tablet   Commonly known as: ZOFRAN   Take by mouth every 8 (eight) hours as needed. nausea      prenatal multivitamin Tabs   Take 1 tablet by mouth daily.        Sharen Counter Certified Nurse-Midwife 02/16/2012 6:10 PM

## 2012-02-18 DIAGNOSIS — O139 Gestational [pregnancy-induced] hypertension without significant proteinuria, unspecified trimester: Secondary | ICD-10-CM

## 2012-02-18 HISTORY — DX: Gestational (pregnancy-induced) hypertension without significant proteinuria, unspecified trimester: O13.9

## 2012-02-18 NOTE — L&D Delivery Note (Signed)
Patient was C/C/+1 and pushed for 15 minutes with epidural.   NSVD female infant, Apgars 8/9.   The patient had a small second degree laceration repaired with 2-0 vicryl. Fundus was firm. EBL was expected. Placenta was delivered intact. Vagina was clear.  Baby was vigorous to bedside.  Philip Aspen

## 2012-04-02 ENCOUNTER — Inpatient Hospital Stay (HOSPITAL_COMMUNITY)
Admission: AD | Admit: 2012-04-02 | Discharge: 2012-04-02 | Disposition: A | Discharge status: Home / Self Care | Payer: PRIVATE HEALTH INSURANCE | Source: Ambulatory Visit | Attending: Obstetrics and Gynecology | Admitting: Obstetrics and Gynecology

## 2012-04-02 ENCOUNTER — Encounter (HOSPITAL_COMMUNITY): Payer: Self-pay | Admitting: *Deleted

## 2012-04-02 DIAGNOSIS — O368190 Decreased fetal movements, unspecified trimester, not applicable or unspecified: Secondary | ICD-10-CM | POA: Insufficient documentation

## 2012-04-02 DIAGNOSIS — O4700 False labor before 37 completed weeks of gestation, unspecified trimester: Secondary | ICD-10-CM | POA: Insufficient documentation

## 2012-04-02 LAB — URINALYSIS, ROUTINE W REFLEX MICROSCOPIC
Bilirubin Urine: NEGATIVE
Glucose, UA: NEGATIVE mg/dL
Ketones, ur: NEGATIVE mg/dL
Leukocytes, UA: NEGATIVE
Specific Gravity, Urine: 1.015 (ref 1.005–1.030)
pH: 7 (ref 5.0–8.0)

## 2012-04-02 NOTE — MAU Note (Signed)
Pt reports fetal movement has been less today than normal. Also c/o  Some cramping. Denies SROM or bleeding

## 2012-04-02 NOTE — MAU Provider Note (Signed)
History     CSN: [id number]  Arrival date and time: 04/02/12 1627   First Provider Initiated Contact with Patient 04/02/12 1700      Chief Complaint  Patient presents with  . Decreased Fetal Movement   HPI Kristin Sellers is 27 y.o. G2P1001 [redacted]w[redacted]d weeks presenting with decreased fetal movement.  Denies vaginal bleeding or loss of fluid.  Is having some mild contractions, not a new sxs.  Patient's of Dr. Dareen Piano, last seen last week.      Past Medical History  Diagnosis Date  . Palpitations   . Hyperlipidemia     Past Surgical History  Procedure Laterality Date  . None    . Mouth surgery    . Pediatric  surgery      Family History  Problem Relation Age of Onset  . Coronary artery disease    . Hypertension    . Heart attack    . Heart disease Mother   . Hyperlipidemia Mother   . Heart disease Father   . Hyperlipidemia Father   . Hypertension Father   . Anesthesia problems Neg Hx   . Hypotension Neg Hx   . Malignant hyperthermia Neg Hx   . Pseudochol deficiency Neg Hx     History  Substance Use Topics  . Smoking status: Never Smoker   . Smokeless tobacco: Not on file  . Alcohol Use: No     Comment: occasional glass of wine when not pregnant    Allergies:  Allergies  Allergen Reactions  . Sulfa Antibiotics Hives    Prescriptions prior to admission  Medication Sig Dispense Refill  . calcium carbonate (TUMS - DOSED IN MG ELEMENTAL CALCIUM) 500 MG chewable tablet Chew 1 tablet by mouth daily as needed (heartburn).      . ondansetron (ZOFRAN) 8 MG tablet Take by mouth every 8 (eight) hours as needed. nausea      . Prenatal Vit-Fe Fumarate-FA (PRENATAL MULTIVITAMIN) TABS Take 1 tablet by mouth daily.        Review of Systems  Constitutional: Negative.   HENT: Negative.   Cardiovascular: Negative.   Gastrointestinal: Positive for abdominal pain (occ contraction).  Genitourinary:       Decreased fetal movement.  Neg for vaginal bleeding/discharge/loss of  fluid    Physical Exam   Blood pressure 119/89, pulse 110, temperature 98.1 F (36.7 C), resp. rate 18, height 5\' 2"  (1.575 Sellers), weight 162 lb 9.6 oz (73.755 kg), unknown if currently breastfeeding.  Physical Exam  Constitutional: She is oriented to person, place, and time. She appears well-developed and well-nourished. No distress.  HENT:  Head: Normocephalic.  Neck: Normal range of motion.  Cardiovascular: Normal rate.   Respiratory: Effort normal.  Neurological: She is alert and oriented to person, place, and time.  Skin: Skin is warm and dry.  Psychiatric: She has a normal mood and affect. Her behavior is normal.   Results for orders placed during the hospital encounter of 04/02/12 (from the past 24 hour(s))  URINALYSIS, ROUTINE W REFLEX MICROSCOPIC     Status: None   Collection Time    04/02/12  4:35 PM      Result Value Range   Color, Urine YELLOW  YELLOW   APPearance CLEAR  CLEAR   Specific Gravity, Urine 1.015  1.005 - 1.030   pH 7.0  5.0 - 8.0   Glucose, UA NEGATIVE  NEGATIVE mg/dL   Hgb urine dipstick NEGATIVE  NEGATIVE   Bilirubin Urine NEGATIVE  NEGATIVE   Ketones, ur NEGATIVE  NEGATIVE mg/dL   Protein, ur NEGATIVE  NEGATIVE mg/dL   Urobilinogen, UA 0.2  0.0 - 1.0 mg/dL   Nitrite NEGATIVE  NEGATIVE   Leukocytes, UA NEGATIVE  NEGATIVE   MAU Course  Procedures   FMS Reactive  MDM 17:44 Reported MSE and FMS to Dr. Henderson Cloud.    Assessment and Plan  A:  Decreased fetal movement       Reactive fetal monitor strip  P:  Discharge to home     Follow up as scheduled in the office    Call for vaginal bleeding, leaking of fluid, increase in contractions or decreased fetal movement  Osiris Charles,Kristin Sellers 04/02/2012, 5:03 PM

## 2012-04-16 LAB — OB RESULTS CONSOLE GBS: GBS: NEGATIVE

## 2012-04-18 ENCOUNTER — Encounter (HOSPITAL_COMMUNITY): Payer: Self-pay | Admitting: *Deleted

## 2012-04-18 ENCOUNTER — Inpatient Hospital Stay (HOSPITAL_COMMUNITY)
Admission: AD | Admit: 2012-04-18 | Discharge: 2012-04-18 | Disposition: A | Discharge status: Home / Self Care | Payer: PRIVATE HEALTH INSURANCE | Source: Ambulatory Visit | Attending: Obstetrics and Gynecology | Admitting: Obstetrics and Gynecology

## 2012-04-18 DIAGNOSIS — O4700 False labor before 37 completed weeks of gestation, unspecified trimester: Secondary | ICD-10-CM | POA: Insufficient documentation

## 2012-04-18 DIAGNOSIS — O99891 Other specified diseases and conditions complicating pregnancy: Secondary | ICD-10-CM | POA: Insufficient documentation

## 2012-04-18 LAB — POCT FERN TEST: POCT Fern Test: NEGATIVE

## 2012-04-18 NOTE — MAU Provider Note (Signed)
27 y.o. G2P1001 at [redacted]w[redacted]d here c/o contractions and leaking fluid. RN requests spec exam for r/o SROM.   Spec: mucous discharge from cervix, no pooling Fern negative  RN to report to Dr. Dareen Piano for plan of care

## 2012-04-18 NOTE — MAU Note (Signed)
Pt presents with complaints leakage of fluid around 230 today, clear fluid. States some cramping since last night.

## 2012-04-24 ENCOUNTER — Inpatient Hospital Stay (HOSPITAL_COMMUNITY)
Admission: AD | Admit: 2012-04-24 | Discharge: 2012-04-27 | DRG: 774 | Disposition: A | Discharge status: Home / Self Care | Payer: PRIVATE HEALTH INSURANCE | Source: Ambulatory Visit | Attending: Obstetrics and Gynecology | Admitting: Obstetrics and Gynecology

## 2012-04-24 ENCOUNTER — Encounter (HOSPITAL_COMMUNITY): Payer: Self-pay | Admitting: Obstetrics and Gynecology

## 2012-04-24 ENCOUNTER — Ambulatory Visit (HOSPITAL_COMMUNITY)
Admit: 2012-04-24 | Discharge: 2012-04-24 | Disposition: A | Discharge status: Home / Self Care | Payer: PRIVATE HEALTH INSURANCE | Attending: Obstetrics and Gynecology | Admitting: Obstetrics and Gynecology

## 2012-04-24 DIAGNOSIS — O163 Unspecified maternal hypertension, third trimester: Secondary | ICD-10-CM

## 2012-04-24 DIAGNOSIS — R209 Unspecified disturbances of skin sensation: Secondary | ICD-10-CM | POA: Diagnosis not present

## 2012-04-24 DIAGNOSIS — O99892 Other specified diseases and conditions complicating childbirth: Secondary | ICD-10-CM | POA: Diagnosis not present

## 2012-04-24 DIAGNOSIS — G510 Bell's palsy: Secondary | ICD-10-CM | POA: Diagnosis not present

## 2012-04-24 DIAGNOSIS — O139 Gestational [pregnancy-induced] hypertension without significant proteinuria, unspecified trimester: Secondary | ICD-10-CM

## 2012-04-24 DIAGNOSIS — R51 Headache: Secondary | ICD-10-CM

## 2012-04-24 DIAGNOSIS — IMO0002 Reserved for concepts with insufficient information to code with codable children: Principal | ICD-10-CM | POA: Diagnosis present

## 2012-04-24 DIAGNOSIS — R29810 Facial weakness: Secondary | ICD-10-CM | POA: Diagnosis not present

## 2012-04-24 LAB — COMPREHENSIVE METABOLIC PANEL
AST: 16 U/L (ref 0–37)
Albumin: 2.3 g/dL — ABNORMAL LOW (ref 3.5–5.2)
CO2: 18 mEq/L — ABNORMAL LOW (ref 19–32)
Calcium: 9.4 mg/dL (ref 8.4–10.5)
Creatinine, Ser: 0.71 mg/dL (ref 0.50–1.10)
GFR calc non Af Amer: >90 mL/min (ref >90)
Total Protein: 6.2 g/dL (ref 6.0–8.3)

## 2012-04-24 LAB — CBC
Hemoglobin: 11.7 g/dL — ABNORMAL LOW (ref 12.0–15.0)
MCH: 31.7 pg (ref 26.0–34.0)
RBC: 3.69 MIL/uL — ABNORMAL LOW (ref 3.87–5.11)

## 2012-04-24 LAB — URINALYSIS, ROUTINE W REFLEX MICROSCOPIC
Glucose, UA: NEGATIVE mg/dL
Protein, ur: NEGATIVE mg/dL
Urobilinogen, UA: 0.2 mg/dL (ref 0.0–1.0)

## 2012-04-24 LAB — PROTEIN / CREATININE RATIO, URINE
Creatinine, Urine: 67.32 mg/dL
Total Protein, Urine: 7.8 mg/dL

## 2012-04-24 LAB — URINE MICROSCOPIC-ADD ON

## 2012-04-24 MED ORDER — MAGNESIUM SULFATE 40 G IN LACTATED RINGERS - SIMPLE
2.0000 g/h | INTRAVENOUS | Status: AC
  Administered 2012-04-24 – 2012-04-25 (×2): 2 g/h via INTRAVENOUS
  Filled 2012-04-24 (×2): qty 500

## 2012-04-24 MED ORDER — TERBUTALINE SULFATE 1 MG/ML IJ SOLN: 0.2500 mg | Freq: Once | INTRAMUSCULAR | Status: DC | PRN

## 2012-04-24 MED ORDER — OXYTOCIN BOLUS FROM INFUSION: 500.0000 mL | INTRAVENOUS | Status: DC

## 2012-04-24 MED ORDER — LIDOCAINE HCL (PF) 1 % IJ SOLN: 30.0000 mL | INTRAMUSCULAR | Status: DC | PRN

## 2012-04-24 MED ORDER — OXYCODONE-ACETAMINOPHEN 5-325 MG PO TABS: 1.0000 | ORAL_TABLET | ORAL | Status: DC | PRN

## 2012-04-24 MED ORDER — ACETAMINOPHEN 500 MG PO TABS
1000.0000 mg | ORAL_TABLET | Freq: Once | ORAL | Status: AC
  Administered 2012-04-24: 1000 mg via ORAL
  Filled 2012-04-24: qty 2

## 2012-04-24 MED ORDER — LACTATED RINGERS IV SOLN
INTRAVENOUS | Status: DC
  Administered 2012-04-24 (×2): via INTRAVENOUS

## 2012-04-24 MED ORDER — MAGNESIUM SULFATE BOLUS VIA INFUSION
4.0000 g | Freq: Once | INTRAVENOUS | Status: AC
  Administered 2012-04-24: 4 g via INTRAVENOUS
  Filled 2012-04-24: qty 500

## 2012-04-24 MED ORDER — OXYCODONE-ACETAMINOPHEN 5-325 MG PO TABS
1.0000 | ORAL_TABLET | Freq: Once | ORAL | Status: AC
  Administered 2012-04-24: 1 via ORAL
  Filled 2012-04-24: qty 1

## 2012-04-24 MED ORDER — ONDANSETRON HCL 4 MG/2ML IJ SOLN: 4.0000 mg | Freq: Four times a day (QID) | INTRAMUSCULAR | Status: DC | PRN

## 2012-04-24 MED ORDER — MISOPROSTOL 25 MCG QUARTER TABLET
25.0000 ug | ORAL_TABLET | ORAL | Status: DC | PRN
  Administered 2012-04-24: 25 ug via VAGINAL
  Filled 2012-04-24: qty 0.25

## 2012-04-24 MED ORDER — CITRIC ACID-SODIUM CITRATE 334-500 MG/5ML PO SOLN: 30.0000 mL | ORAL | Status: DC | PRN

## 2012-04-24 MED ORDER — IBUPROFEN 600 MG PO TABS: 600.0000 mg | ORAL_TABLET | Freq: Four times a day (QID) | ORAL | Status: DC | PRN

## 2012-04-24 MED ORDER — OXYTOCIN 40 UNITS IN LACTATED RINGERS INFUSION - SIMPLE MED: 62.5000 mL/h | INTRAVENOUS | Status: DC

## 2012-04-24 MED ORDER — ACETAMINOPHEN 325 MG PO TABS
650.0000 mg | ORAL_TABLET | ORAL | Status: DC | PRN
  Administered 2012-04-24: 650 mg via ORAL
  Filled 2012-04-24: qty 2

## 2012-04-24 MED ORDER — FLEET ENEMA 7-19 GM/118ML RE ENEM: 1.0000 | ENEMA | RECTAL | Status: DC | PRN

## 2012-04-24 MED ORDER — LACTATED RINGERS IV SOLN: 500.0000 mL | INTRAVENOUS | Status: DC | PRN

## 2012-04-24 NOTE — MAU Note (Signed)
"  I called my doctor yesterday.  I told her I had a H/A that started Thursday, swelling in my face that evening.  I started checking my BP.  Yesterday my BP was getting higher and higher.  She told me to come in if it got worse, so I came this morning."

## 2012-04-24 NOTE — Progress Notes (Signed)
Dr Claiborne Billings made aware that pt continues to have h//a, a left facial droop, and can't raise her eyebrows - will continue to monitor

## 2012-04-24 NOTE — ED Notes (Signed)
Pt returned to room.  Rapid response nurse at bedside.  Neurologist also at bedside

## 2012-04-24 NOTE — Consult Note (Signed)
Reason for Consult:Headache with facial droop Referring Physician: Delray Alt  CC: Headache  History is obtained from:patient  HPI: Kristin Sellers is a 27 y.o. female [facility] 37 [facility] with new onset headache. It started 4 days ago and has been relentless, prompting her to seek treatment for it earlier tonight. She also complained of some feeling of "swelling" of her face and neck. This started over the past day or so. She was looking in the mirror and noticed a left facial droop. This is persistent. The headache is bifrontal, maybe worse on the left side.   ROS: A 14 point ROS was performed and is negative except as noted in the HPI.  Past Medical History  Diagnosis Date  . Palpitations   . Hyperlipidemia     Family History: Sister - migraine  Social History: Tob: Never Smoker  Exam: Current vital signs: BP 118/86  Pulse 94  Temp(Src) 97.9 F (36.6 C) (Oral)  Resp 18  Ht 5\' 2"  (1.575 m)  Wt 77.111 kg (170 lb)  BMI 31.09 kg/m2 Vital signs in last 24 hours: Temp:  [97.9 F (36.6 C)-98 F (36.7 C)] 97.9 F (36.6 C) (03/08 2010) Pulse Rate:  [91-121] 94 (03/08 2101) Resp:  [18] 18 (03/08 2101) BP: (106-158)/(69-102) 118/86 mmHg (03/08 2101) Weight:  [77.111 kg (170 lb)] 77.111 kg (170 lb) (03/08 1731)  General: in wheelchair, NAD CV: RRR Mental Status: Patient is awake, alert, oriented to person, place, month, year, and situation. Immediate and remote memory are intact. Patient is able to give a clear and coherent history. No signs of aphasia or neglect.  Cranial Nerves: II: Visual Fields are full. Pupils are equal, round, and reactive to light.  Discs are difficult to visualize. III,IV, VI: EOMI without ptosis or diploplia.  V: Facial sensation is symmetric to temperature VII: Facial movement is notable for left weakness of upper and lower face(difficulty with holding eyelid closed) VIII: hearing is intact to voice X: Uvula elevates  symmetrically XI: Shoulder shrug is symmetric. XII: tongue is midline without atrophy or fasciculations.  Motor: Tone is normal. Bulk is normal. 5/5 strength was present in all four extremities.  Sensory: Sensation is symmetric to light touch and temperature in the arms and legs. Deep Tendon Reflexes: 3+ and symmetric in the biceps and patellae.  Cerebellar: FNF  intact bilaterally Gait: Not tested due to patient safety concerns.    I have reviewed labs in epic and the results pertinent to this consultation are: CMP, mildly low protein, hyponatremia  I have reviewed the images obtained:MRV/MRI negative.   Impression: 27 yo F with left facial weakness and elevated blood pressure at 37 weeks of pregnancy. Given the headache and neurological sign, an MRI/MRV was done to rule out VST or stroke. Given that this is negative and her weakness is consistent with a peripheral etiology, this is consistent with a bell's palsy which has been described in association with onset of preeclampsia.   Recommendations: 1) When safe from OB perspective, would start prednisone 60mg  Qday x 7 days. Preferably within three days of symptom onset, but without treatment, most patient's still do have some recovery, and most recover fully. Therefore if it presents risk during the perinatal process, this would need to be considered.  2) Please call with any further questions, neurology will sign off at this time.    Ritta Slot, MD [facility] [phone]  If 7pm- 7am, please page neurology on call at [phone].

## 2012-04-24 NOTE — Progress Notes (Signed)
Called Dr. Claiborne Billings to report left sided swelling, and facial droop.  Dr. Claiborne Billings orders Neuro consult.  MRI ordered.  EMS called to come transfer pt. Dr. Claiborne Billings calls into pt room to talk to pt

## 2012-04-24 NOTE — Progress Notes (Signed)
This note also relates to the following rows which could not be included: Pulse Rate - Cannot attach notes to unvalidated device data SpO2 - Cannot attach notes to unvalidated device data   Pt taken off monitors per orders to transfer to [facility] for MRI per EMS.  Pt now not able to open right eye as well and is blinking freq.

## 2012-04-24 NOTE — Progress Notes (Signed)
I saw pt to evaluate report  facial drooping and severe L sided HA sudden onset 1600 today. Pt understands she will have MRI at [facility]. Procedure explained , Pt verbalizes consent for transfer to [facility] for MRI. Will be accompanied by RN and family member.

## 2012-04-24 NOTE — ED Notes (Signed)
carelink called to transport pt back to [facility] hospital

## 2012-04-24 NOTE — Progress Notes (Signed)
Spoke with Dr. Amada Jupiter of Neurology.  Based on preliminary review of MRI and his assessment, he believes pt's symptoms are due to Bell's Palsy.  Can hold on steriods and monitor.  Proceed with IOL.

## 2012-04-24 NOTE — MAU Provider Note (Signed)
Chief Complaint:  Headache and Hypertension   First Provider Initiated Contact with Patient 04/24/12 1118      HPI: Kristin Sellers is a 27 y.o. G2P1001 at [redacted]w[redacted]d who presents to maternity admissions reporting persistent constant frontal headache for 2 days and blood pressure at home 154/100. Tylenol (650mg  po x1 2 hr PTA) ineffective for headache. Does not typically get tension headaches. Denies painful contractions, leakage of fluid or vaginal bleeding. Good fetal movement.   Pregnancy Course: essentially uncomplicated. Hx benign tachycardia with Cards W/U and on beta blocker pre-pregnnacy  Past Medical History: Past Medical History  Diagnosis Date  . Palpitations   . Hyperlipidemia     Past obstetric history: OB History   Grav Para Term Preterm Abortions TAB SAB Ect Mult Living   2 1 1  0 0 0 0 0 0 1     # Outc Date GA Lbr Len/2nd Wgt Sex Del Anes PTL Lv   1 TRM 2/13 [redacted]w[redacted]d 20:24 / 04:13 3.501kg(7lb11.5oz) F SVD EPI  Yes   Comments: wnl   2 CUR               Past Surgical History: Past Surgical History  Procedure Laterality Date  . None    . Mouth surgery    . Pediatric  surgery      Family History: Family History  Problem Relation Age of Onset  . Coronary artery disease    . Hypertension    . Heart attack    . Heart disease Mother   . Hyperlipidemia Mother   . Heart disease Father   . Hyperlipidemia Father   . Hypertension Father   . Anesthesia problems Neg Hx   . Hypotension Neg Hx   . Malignant hyperthermia Neg Hx   . Pseudochol deficiency Neg Hx     Social History: History  Substance Use Topics  . Smoking status: Never Smoker   . Smokeless tobacco: Not on file  . Alcohol Use: No     Comment: occasional glass of wine when not pregnant    Allergies:  Allergies  Allergen Reactions  . Sulfa Antibiotics Hives    Meds:  Prescriptions prior to admission  Medication Sig Dispense Refill  . calcium carbonate (TUMS - DOSED IN MG ELEMENTAL CALCIUM) 500 MG  chewable tablet Chew 3 tablets by mouth daily as needed for heartburn.       . ondansetron (ZOFRAN) 8 MG tablet Take 8 mg by mouth every 8 (eight) hours as needed for nausea. nausea        ROS: Pertinent findings in history of present illness. No palpitations or CP. Left outer ear feels swollen, no popping or difficulty hearing. No other URI sx.   Physical Exam  Blood pressure 158/98, pulse 121, temperature 98 F (36.7 C), temperature source Oral, resp. rate 18, height 5\' 2"  (1.575 m), weight 77.111 kg (170 lb), unknown if currently breastfeeding.  Filed Vitals:   04/24/12 1300 04/24/12 1315 04/24/12 1330 04/24/12 1345  BP: 115/88 113/86 111/84 121/87  Pulse: 104 108 106 110  Temp:      TempSrc:      Resp:      Height:      Weight:       Filed Vitals:   04/24/12 1516 04/24/12 1531 04/24/12 1545 04/24/12 1600  BP: 126/88 114/83 111/81 107/88  Pulse: 106 109 92 109  Temp:      TempSrc:      Resp:  Height:      Weight:       Filed Vitals:   04/24/12 1516 04/24/12 1531 04/24/12 1545 04/24/12 1600  BP: 126/88 114/83 111/81 107/88  Pulse: 106 109 92 109  Temp:      TempSrc:      Resp:      Height:      Weight:       GENERAL: Well-developed, well-nourished female in no acute distress.  HEENT: normocephalic HEART: normal rate RESP: normal effort ABDOMEN: Soft, non-tender, gravid appropriate for gestational age EXTREMITIES: Nontender, no edema NEURO: alert and oriented SPECULUM EXAM: NEFG, physiologic discharge, no blood, cervix clean  SVE: post, 1 high  FHT:  Baseline 130 , moderate variability, accelerations present, no decelerations Contractions: occasional, mild   Labs: Results for orders placed during the hospital encounter of 04/24/12 (from the past 24 hour(s))  PROTEIN / CREATININE RATIO, URINE     Status: None   Collection Time    04/24/12 10:46 AM      Result Value Range   Creatinine, Urine 67.32     Total Protein, Urine 7.8     PROTEIN CREATININE RATIO  0.12  0.00 - 0.15  URINALYSIS, ROUTINE W REFLEX MICROSCOPIC     Status: Abnormal   Collection Time    04/24/12 10:46 AM      Result Value Range   Color, Urine YELLOW  YELLOW   APPearance HAZY (*) CLEAR   Specific Gravity, Urine 1.020  1.005 - 1.030   pH 6.0  5.0 - 8.0   Glucose, UA NEGATIVE  NEGATIVE mg/dL   Hgb urine dipstick NEGATIVE  NEGATIVE   Bilirubin Urine NEGATIVE  NEGATIVE   Ketones, ur NEGATIVE  NEGATIVE mg/dL   Protein, ur NEGATIVE  NEGATIVE mg/dL   Urobilinogen, UA 0.2  0.0 - 1.0 mg/dL   Nitrite NEGATIVE  NEGATIVE   Leukocytes, UA SMALL (*) NEGATIVE  URINE MICROSCOPIC-ADD ON     Status: Abnormal   Collection Time    04/24/12 10:46 AM      Result Value Range   Squamous Epithelial / LPF MANY (*) RARE   WBC, UA 11-20  <3 WBC/hpf   RBC / HPF 0-2  <3 RBC/hpf   Bacteria, UA MANY (*) RARE  COMPREHENSIVE METABOLIC PANEL     Status: Abnormal   Collection Time    04/24/12 11:15 AM      Result Value Range   Sodium 131 (*) 135 - 145 mEq/L   Potassium 3.7  3.5 - 5.1 mEq/L   Chloride 100  96 - 112 mEq/L   CO2 18 (*) 19 - 32 mEq/L   Glucose, Bld 124 (*) 70 - 99 mg/dL   BUN 8  6 - 23 mg/dL   Creatinine, Ser 3.87  0.50 - 1.10 mg/dL   Calcium 9.4  8.4 - 56.4 mg/dL   Total Protein 6.2  6.0 - 8.3 g/dL   Albumin 2.3 (*) 3.5 - 5.2 g/dL   AST 16  0 - 37 U/L   ALT 10  0 - 35 U/L   Alkaline Phosphatase 152 (*) 39 - 117 U/L   Total Bilirubin 0.3  0.3 - 1.2 mg/dL   GFR calc non Af Amer >90  >90 mL/min   GFR calc Af Amer >90  >90 mL/min  CBC     Status: Abnormal   Collection Time    04/24/12 11:15 AM      Result Value Range   WBC 7.8  4.0 - 10.5 K/uL   RBC 3.69 (*) 3.87 - 5.11 MIL/uL   Hemoglobin 11.7 (*) 12.0 - 15.0 g/dL   HCT 62.1 (*) 30.8 - 65.7 %   MCV 93.2  78.0 - 100.0 fL   MCH 31.7  26.0 - 34.0 pg   MCHC 34.0  30.0 - 36.0 g/dL   RDW 84.6  96.2 - 95.2 %   Platelets 205  150 - 400 K/uL     MAU Course: Percocet 5mg  po given with alleviation of H/A, but still has mild  frontal H/A  1350: C/W Dr. Claiborne Billings: Plan to follow with serial BPs for at least 4 hrs and continue to treat headache. Acetaminophen 1000 mg ordered. 1415: Headache improved, not resolved. DBP>/=90 x 3. Now c/o lips swollen and numb. Dr. Claiborne Billings has been in to see pt.   Assessment: 1. Elevated blood pressure complicating pregnancy, antepartum, third trimester   2. Headache   G2P1001 @ [redacted]w[redacted]d with mildly elevated BPs and H/A unresponsive to analgesics  Plan: Dr. Claiborne Billings is following.  Danae Orleans, CNM 04/24/2012 11:29 AM

## 2012-04-24 NOTE — ED Notes (Signed)
Pt in MRI, report recieved

## 2012-04-24 NOTE — H&P (Signed)
27 y.o. [redacted]w[redacted]d  G2P1001 comes in c/o severe headache and feeling very poorly since Thursday. She states the pregnancy was been unremarkable and she has felt well up to Thursday. Pt has taken multiple doses of tylenol with minimal relief, headache has never gone away completely.  Pt states she also noticed that she has had worsening facial swelling and neck swelling, noted when trying to eat, drink swallow.  She denies vision change of RUQ pain.  Pt states last pregnancy she developed PIH when she came in labor.  BPs remained high postpartum for 4-6 weeks then eventually normalized to her usual low 100s.  Otherwise has good fetal movement and no bleeding.  Past Medical History  Diagnosis Date  . Palpitations   . Hyperlipidemia     Past Surgical History  Procedure Laterality Date  . None    . Mouth surgery    . Pediatric  surgery      OB History   Grav Para Term Preterm Abortions TAB SAB Ect Mult Living   2 1 1  0 0 0 0 0 0 1     # Outc Date GA Lbr Len/2nd Wgt Sex Del Anes PTL Lv   1 TRM 2/13 [redacted]w[redacted]d 20:24 / 04:13 3.501kg(7lb11.5oz) F SVD EPI  Yes   Comments: wnl   2 CUR               History   Social History  . Marital Status: Married    Spouse Name: N/A    Number of Children: 1  . Years of Education: N/A   Occupational History  . RN    Social History Main Topics  . Smoking status: Never Smoker   . Smokeless tobacco: Never Used  . Alcohol Use: No     Comment: occasional glass of wine when not pregnant  . Drug Use: No  . Sexually Active: Yes    Birth Control/ Protection: None   Other Topics Concern  . Not on file   Social History Narrative  . No narrative on file   Sulfa antibiotics    Prenatal Transfer Tool  Maternal Diabetes: No Genetic Screening: Declined Fetal Ultrasounds or other Referrals:  Other:  no anatomy US documented in chart, per patient normal Maternal Substance Abuse:  No Significant Maternal Medications:  None Significant Maternal Lab Results: Lab  values include: Group B Strep negative  Other PNC: N/C    Filed Vitals:   04/24/12 1700  BP: 128/102  Pulse: 120  Temp:   Resp:      Lungs/Cor:  NAD Abdomen:  soft, gravid Ex:  no cords, erythema SVE:  1/50/-3 FHTs:  130 good STV, NST R Toco:  irreg   A/P   Admit for induction of labor due to severe preeclampsia 1) Pt meets critieria for GHTN based on 2 BP's >140 systolic and >90 diastolic greater than 4 hrs apart.  Pt monitored for multiple hours, given additional tylenol and severe headache never resolved, she is thus meeting criteria for severe preeclampsia.  Will start MgSO4 and continue until 24hr pp.  Cervical ripening with misoprostol. 2) repeat labs am 3) continue other routine care.  GBS Neg  CALLAHAN, SIDNEY

## 2012-04-24 NOTE — MAU Note (Signed)
Pt presents with complaints of an increase in blood pressure with a headache that won't go away and she has been taking tylenol. States that she talked to Dr Claiborne Billings yesterday and she told pt to come in to be evaluated if her blood pressures stated increased.

## 2012-04-24 NOTE — ED Notes (Signed)
Pt resting with family.  Rapid response nurse at bedside. Awaiting carelink

## 2012-04-25 ENCOUNTER — Encounter (HOSPITAL_COMMUNITY): Payer: Self-pay | Admitting: Anesthesiology

## 2012-04-25 ENCOUNTER — Encounter (HOSPITAL_COMMUNITY): Payer: Self-pay | Admitting: *Deleted

## 2012-04-25 ENCOUNTER — Inpatient Hospital Stay (HOSPITAL_COMMUNITY): Payer: PRIVATE HEALTH INSURANCE | Admitting: Anesthesiology

## 2012-04-25 LAB — CBC
HCT: 34.6 % — ABNORMAL LOW (ref 36.0–46.0)
HCT: 33.2 % — ABNORMAL LOW (ref 36.0–46.0)
Hemoglobin: 11.4 g/dL — ABNORMAL LOW (ref 12.0–15.0)
MCHC: 33.7 g/dL (ref 30.0–36.0)
MCV: 94.5 fL (ref 78.0–100.0)
MCV: 93.8 fL (ref 78.0–100.0)
RBC: 3.66 MIL/uL — ABNORMAL LOW (ref 3.87–5.11)
RDW: 13 % (ref 11.5–15.5)
WBC: 8.1 10*3/uL (ref 4.0–10.5)

## 2012-04-25 LAB — COMPREHENSIVE METABOLIC PANEL
Albumin: 2.2 g/dL — ABNORMAL LOW (ref 3.5–5.2)
BUN: 6 mg/dL (ref 6–23)
Creatinine, Ser: 0.72 mg/dL (ref 0.50–1.10)
Potassium: 3.8 mEq/L (ref 3.5–5.1)
Total Protein: 6 g/dL (ref 6.0–8.3)

## 2012-04-25 MED ORDER — ONDANSETRON HCL 4 MG PO TABS
4.0000 mg | ORAL_TABLET | ORAL | Status: DC | PRN
  Administered 2012-04-27: 4 mg via ORAL
  Filled 2012-04-25: qty 1

## 2012-04-25 MED ORDER — PHENYLEPHRINE 40 MCG/ML (10ML) SYRINGE FOR IV PUSH (FOR BLOOD PRESSURE SUPPORT)
80.0000 ug | PREFILLED_SYRINGE | INTRAVENOUS | Status: DC | PRN
  Filled 2012-04-25: qty 5

## 2012-04-25 MED ORDER — OXYTOCIN 40 UNITS IN LACTATED RINGERS INFUSION - SIMPLE MED
1.0000 m[IU]/min | INTRAVENOUS | Status: DC
  Administered 2012-04-25: 1.333 m[IU]/min via INTRAVENOUS
  Filled 2012-04-25: qty 1000

## 2012-04-25 MED ORDER — WITCH HAZEL-GLYCERIN EX PADS: 1.0000 "application " | MEDICATED_PAD | CUTANEOUS | Status: DC | PRN

## 2012-04-25 MED ORDER — DIPHENHYDRAMINE HCL 50 MG/ML IJ SOLN: 12.5000 mg | INTRAMUSCULAR | Status: DC | PRN

## 2012-04-25 MED ORDER — ACETAMINOPHEN 325 MG PO TABS
650.0000 mg | ORAL_TABLET | ORAL | Status: DC | PRN
  Administered 2012-04-25: 650 mg via ORAL
  Filled 2012-04-25: qty 2

## 2012-04-25 MED ORDER — OXYCODONE-ACETAMINOPHEN 5-325 MG PO TABS: 1.0000 | ORAL_TABLET | ORAL | Status: DC | PRN

## 2012-04-25 MED ORDER — GENTAMICIN SULFATE 40 MG/ML IJ SOLN
120.0000 mg | Freq: Once | INTRAVENOUS | Status: AC
  Administered 2012-04-25: 120 mg via INTRAVENOUS
  Filled 2012-04-25: qty 3

## 2012-04-25 MED ORDER — TERBUTALINE SULFATE 1 MG/ML IJ SOLN: 0.2500 mg | Freq: Once | INTRAMUSCULAR | Status: DC | PRN

## 2012-04-25 MED ORDER — PRENATAL MULTIVITAMIN CH
1.0000 | ORAL_TABLET | Freq: Every day | ORAL | Status: DC
  Administered 2012-04-26 – 2012-04-27 (×2): 1 via ORAL
  Filled 2012-04-25 (×2): qty 1

## 2012-04-25 MED ORDER — SODIUM CHLORIDE 0.9 % IV SOLN
2.0000 g | Freq: Once | INTRAVENOUS | Status: AC
  Administered 2012-04-25: 2 g via INTRAVENOUS
  Filled 2012-04-25: qty 2000

## 2012-04-25 MED ORDER — DIBUCAINE 1 % RE OINT: 1.0000 "application " | TOPICAL_OINTMENT | RECTAL | Status: DC | PRN

## 2012-04-25 MED ORDER — OXYTOCIN BOLUS FROM INFUSION: 500.0000 mL | INTRAVENOUS | Status: DC

## 2012-04-25 MED ORDER — MISOPROSTOL 25 MCG QUARTER TABLET
25.0000 ug | ORAL_TABLET | ORAL | Status: DC | PRN
  Administered 2012-04-25 (×2): 25 ug via VAGINAL
  Filled 2012-04-25 (×2): qty 0.25

## 2012-04-25 MED ORDER — OXYTOCIN 40 UNITS IN LACTATED RINGERS INFUSION - SIMPLE MED: 62.5000 mL/h | INTRAVENOUS | Status: DC

## 2012-04-25 MED ORDER — IBUPROFEN 600 MG PO TABS
600.0000 mg | ORAL_TABLET | Freq: Four times a day (QID) | ORAL | Status: DC
  Administered 2012-04-25 – 2012-04-27 (×6): 600 mg via ORAL
  Filled 2012-04-25 (×7): qty 1

## 2012-04-25 MED ORDER — LACTATED RINGERS IV SOLN: 500.0000 mL | Freq: Once | INTRAVENOUS | Status: DC

## 2012-04-25 MED ORDER — FENTANYL 2.5 MCG/ML BUPIVACAINE 1/10 % EPIDURAL INFUSION (WH - ANES)
INTRAMUSCULAR | Status: DC | PRN
  Administered 2012-04-25: 14 mL/h via EPIDURAL

## 2012-04-25 MED ORDER — EPHEDRINE 5 MG/ML INJ
10.0000 mg | INTRAVENOUS | Status: DC | PRN
Start: 2012-04-25 — End: 2012-04-25
  Administered 2012-04-25: 10:00:00 via INTRAVENOUS

## 2012-04-25 MED ORDER — TETANUS-DIPHTH-ACELL PERTUSSIS 5-2.5-18.5 LF-MCG/0.5 IM SUSP
0.5000 mL | Freq: Once | INTRAMUSCULAR | Status: DC
  Filled 2012-04-25: qty 0.5

## 2012-04-25 MED ORDER — BENZOCAINE-MENTHOL 20-0.5 % EX AERO
1.0000 "application " | INHALATION_SPRAY | CUTANEOUS | Status: DC | PRN
  Filled 2012-04-25: qty 56

## 2012-04-25 MED ORDER — EPHEDRINE 5 MG/ML INJ
10.0000 mg | INTRAVENOUS | Status: DC | PRN
  Administered 2012-04-25: 10 mg via INTRAVENOUS
  Filled 2012-04-25: qty 4

## 2012-04-25 MED ORDER — LIDOCAINE HCL (PF) 1 % IJ SOLN: 30.0000 mL | INTRAMUSCULAR | Status: DC | PRN

## 2012-04-25 MED ORDER — FENTANYL 2.5 MCG/ML BUPIVACAINE 1/10 % EPIDURAL INFUSION (WH - ANES)
14.0000 mL/h | INTRAMUSCULAR | Status: DC
  Filled 2012-04-25: qty 125

## 2012-04-25 MED ORDER — LANOLIN HYDROUS EX OINT: TOPICAL_OINTMENT | CUTANEOUS | Status: DC | PRN

## 2012-04-25 MED ORDER — LACTATED RINGERS IV SOLN
500.0000 mL | INTRAVENOUS | Status: DC | PRN
  Administered 2012-04-25: 500 mL via INTRAVENOUS

## 2012-04-25 MED ORDER — SENNOSIDES-DOCUSATE SODIUM 8.6-50 MG PO TABS
2.0000 | ORAL_TABLET | Freq: Every day | ORAL | Status: DC
  Administered 2012-04-25 – 2012-04-26 (×2): 2 via ORAL

## 2012-04-25 MED ORDER — SIMETHICONE 80 MG PO CHEW: 80.0000 mg | CHEWABLE_TABLET | ORAL | Status: DC | PRN

## 2012-04-25 MED ORDER — ONDANSETRON HCL 4 MG/2ML IJ SOLN: 4.0000 mg | INTRAMUSCULAR | Status: DC | PRN

## 2012-04-25 MED ORDER — LIDOCAINE HCL (PF) 1 % IJ SOLN
INTRAMUSCULAR | Status: DC | PRN
  Administered 2012-04-25 (×2): 5 mL

## 2012-04-25 MED ORDER — LACTATED RINGERS IV SOLN: INTRAVENOUS | Status: DC

## 2012-04-25 MED ORDER — PHENYLEPHRINE 40 MCG/ML (10ML) SYRINGE FOR IV PUSH (FOR BLOOD PRESSURE SUPPORT): 80.0000 ug | PREFILLED_SYRINGE | INTRAVENOUS | Status: DC | PRN

## 2012-04-25 MED ORDER — IBUPROFEN 600 MG PO TABS: 600.0000 mg | ORAL_TABLET | Freq: Four times a day (QID) | ORAL | Status: DC | PRN

## 2012-04-25 MED ORDER — ONDANSETRON HCL 4 MG/2ML IJ SOLN
4.0000 mg | Freq: Four times a day (QID) | INTRAMUSCULAR | Status: DC | PRN
  Administered 2012-04-25: 4 mg via INTRAVENOUS
  Filled 2012-04-25: qty 2

## 2012-04-25 MED ORDER — DIPHENHYDRAMINE HCL 25 MG PO CAPS: 25.0000 mg | ORAL_CAPSULE | Freq: Four times a day (QID) | ORAL | Status: DC | PRN

## 2012-04-25 MED ORDER — ZOLPIDEM TARTRATE 5 MG PO TABS: 5.0000 mg | ORAL_TABLET | Freq: Every evening | ORAL | Status: DC | PRN

## 2012-04-25 MED ORDER — OXYCODONE-ACETAMINOPHEN 5-325 MG PO TABS
1.0000 | ORAL_TABLET | Freq: Once | ORAL | Status: AC
  Administered 2012-04-25: 2 via ORAL
  Filled 2012-04-25: qty 2

## 2012-04-25 MED ORDER — CITRIC ACID-SODIUM CITRATE 334-500 MG/5ML PO SOLN: 30.0000 mL | ORAL | Status: DC | PRN

## 2012-04-25 NOTE — Progress Notes (Signed)
Patient in MRI 

## 2012-04-25 NOTE — Anesthesia Preprocedure Evaluation (Signed)
Anesthesia Evaluation  Patient identified by MRN, date of birth, ID band Patient awake    Reviewed: Allergy & Precautions, H&P , NPO status , Patient's Chart, lab work & pertinent test results  History of Anesthesia Complications Negative for: history of anesthetic complications  Airway Mallampati: II TM Distance: >3 FB Neck ROM: full    Dental no notable dental hx. (+) Teeth Intact   Pulmonary neg pulmonary ROS,  breath sounds clear to auscultation  Pulmonary exam normal       Cardiovascular negative cardio ROS  Rhythm:regular Rate:Normal     Neuro/Psych negative neurological ROS  negative psych ROS   GI/Hepatic negative GI ROS, Neg liver ROS,   Endo/Other  negative endocrine ROS  Renal/GU negative Renal ROS  negative genitourinary   Musculoskeletal   Abdominal Normal abdominal exam  (+)   Peds  Hematology negative hematology ROS (+)   Anesthesia Other Findings   Reproductive/Obstetrics (+) Pregnancy PIH                           Anesthesia Physical Anesthesia Plan  ASA: II  Anesthesia Plan: Epidural   Post-op Pain Management:    Induction:   Airway Management Planned:   Additional Equipment:   Intra-op Plan:   Post-operative Plan:   Informed Consent: I have reviewed the patients History and Physical, chart, labs and discussed the procedure including the risks, benefits and alternatives for the proposed anesthesia with the patient or authorized representative who has indicated his/her understanding and acceptance.     Plan Discussed with:   Anesthesia Plan Comments:         Anesthesia Quick Evaluation

## 2012-04-25 NOTE — Progress Notes (Signed)
Called Dr. Claiborne Billings to report severe h/a, also informed her of uc being too close to insert another cytotec.  Orders to place when able.  Percocet ordered for ha

## 2012-04-25 NOTE — Anesthesia Procedure Notes (Signed)
Epidural Patient location during procedure: OB  Staffing Anesthesiologist: Phillips Grout Performed by: anesthesiologist   Preanesthetic Checklist Completed: patient identified, site marked, surgical consent, pre-op evaluation, timeout performed, IV checked, risks and benefits discussed and monitors and equipment checked  Epidural Patient position: sitting Prep: ChloraPrep Patient monitoring: heart rate, continuous pulse ox and blood pressure Approach: right paramedian Injection technique: LOR saline  Needle:  Needle type: Tuohy  Needle gauge: 17 G Needle length: 9 cm and 9 Catheter type: closed end flexible Catheter size: 20 Guage Test dose: negative  Assessment Events: blood not aspirated, injection not painful, no injection resistance, negative IV test and no paresthesia  Additional Notes   Patient tolerated the insertion well without complications.

## 2012-04-26 LAB — CBC
MCH: 31.6 pg (ref 26.0–34.0)
Platelets: 186 10*3/uL (ref 150–400)
RBC: 3.13 MIL/uL — ABNORMAL LOW (ref 3.87–5.11)
WBC: 11.7 10*3/uL — ABNORMAL HIGH (ref 4.0–10.5)

## 2012-04-26 LAB — COMPREHENSIVE METABOLIC PANEL
ALT: 8 U/L (ref 0–35)
AST: 16 U/L (ref 0–37)
CO2: 22 mEq/L (ref 19–32)
Calcium: 6.6 mg/dL — ABNORMAL LOW (ref 8.4–10.5)
GFR calc non Af Amer: >90 mL/min (ref >90)
Sodium: 135 mEq/L (ref 135–145)

## 2012-04-26 MED ORDER — PREDNISONE 50 MG PO TABS
60.0000 mg | ORAL_TABLET | Freq: Every day | ORAL | Status: DC
  Administered 2012-04-26 – 2012-04-27 (×2): 60 mg via ORAL
  Filled 2012-04-26 (×2): qty 1

## 2012-04-26 MED ORDER — LACTATED RINGERS IV SOLN
INTRAVENOUS | Status: DC
  Administered 2012-04-26 (×2): via INTRAVENOUS

## 2012-04-26 NOTE — Progress Notes (Signed)
UR chart review completed.  

## 2012-04-26 NOTE — Anesthesia Postprocedure Evaluation (Signed)
  Anesthesia Post-op Note  Patient: Kristin Sellers  Procedure(s) Performed: * No procedures listed *  Patient Location: PACU and A-ICU  Anesthesia Type:Epidural  Level of Consciousness: awake, alert  and oriented  Airway and Oxygen Therapy: Patient Spontanous Breathing  Post-op Pain: mild  Post-op Assessment: Patient's Cardiovascular Status Stable, Respiratory Function Stable, No signs of Nausea or vomiting, Adequate PO intake, Pain level controlled, No headache, No backache, No residual numbness and No residual motor weakness  Post-op Vital Signs: stable  Complications: No apparent anesthesia complications

## 2012-04-26 NOTE — Progress Notes (Signed)
Report called to Sheralyn Boatman, RN on MBW.  Will transfer via wheelchair in sts condition acc by husband, baby in crib.

## 2012-04-26 NOTE — Progress Notes (Signed)
Post Partum Day 1 Subjective: Pt feels groggy and her legs feel weak. She denies headache.  She does have some double vision, but no visual scotoma  Objective: Filed Vitals:   04/26/12 0800 04/26/12 0854 04/26/12 0913 04/26/12 1000  BP: 111/65 116/72 117/77 110/51  Pulse: 87 92  94  Temp: 98.3 F (36.8 C)     TempSrc: Oral     Resp: 18     Height:      Weight:      SpO2: 98% 99%  98%    Physical Exam:  General: alert, cooperative, appears stated age and left facial weakness apparent by lack of upturning of mouth with smile Lochia: appropriate Uterine Fundus: firm DVT Evaluation: No evidence of DVT seen on physical exam.   Recent Labs  04/25/12 0620 04/26/12 0530  HGB 11.2* 9.9*  HCT 33.2* 29.0*    Assessment/Plan: 1) Continue current care in AICU.  2) Stop Magnesium at 3:59 pm when pt 24 hours out from delivery.  When mag off monitor in AICU for 4 hours.  If remains stable then will transfer to regular PP floor 3) Start prednisone for Bell's Palsy 4) Desires neonatal circ.  R/B/A reviewed. Will proceed   LOS: 2 days   ROSS,Tajanae H. 04/26/2012, 10:30 AM

## 2012-04-27 NOTE — Discharge Summary (Signed)
Obstetric Discharge Summary Reason for Admission: induction of labor, preeclampsia Prenatal Procedures: ultrasound Intrapartum Procedures: spontaneous vaginal delivery Postpartum Procedures: Magnesium prophylaxis, Prednisone for Bell's Palsy Complications-Operative and Postpartum: none Hemoglobin  Date Value Range Status  04/26/2012 9.9* 12.0 - 15.0 g/dL Final     HCT  Date Value Range Status  04/26/2012 29.0* 36.0 - 46.0 % Final    Physical Exam:  General: alert, Left Bell's palsy Lochia: appropriate Uterine Fundus: firm  Discharge Diagnoses: Term Pregnancy-delivered, Preelampsia and Bell's palsy  Discharge Information: Date: 04/27/2012 Activity: pelvic rest Diet: routine Medications: PNV, Ibuprofen and Prednisone taper Condition: stable Instructions: refer to practice specific booklet Discharge to: home Follow-up Information   Follow up with CALLAHAN, SIDNEY, DO In 4 weeks.   Contact information:   [address] [place] [address] [phone]       Newborn Data: Live born female  Birth Weight: 7 lb 11.1 oz (3490 g) APGAR: 8, 9  Home with mother.  KAPLAN,RICHARD D 04/27/2012, 9:54 AM

## 2012-04-28 LAB — URINE CULTURE: Colony Count: >=100000

## 2013-01-19 ENCOUNTER — Other Ambulatory Visit: Payer: Self-pay | Admitting: Physician Assistant

## 2013-01-19 LAB — HCG, QUANTITATIVE, PREGNANCY: Beta Hcg, Quant.: <1 m[IU]/mL — ABNORMAL LOW

## 2013-08-23 ENCOUNTER — Other Ambulatory Visit: Payer: Self-pay | Admitting: Obstetrics and Gynecology

## 2013-08-24 LAB — CYTOLOGY - PAP

## 2013-09-01 ENCOUNTER — Ambulatory Visit: Payer: PRIVATE HEALTH INSURANCE | Admitting: Adult Health

## 2013-09-07 ENCOUNTER — Encounter: Payer: Self-pay | Admitting: Adult Health

## 2013-09-07 ENCOUNTER — Ambulatory Visit (INDEPENDENT_AMBULATORY_CARE_PROVIDER_SITE_OTHER): Payer: PRIVATE HEALTH INSURANCE | Admitting: Adult Health

## 2013-09-07 VITALS — BP 125/79 | HR 87 | Temp 97.9°F | Resp 14 | Ht 62.5 in | Wt 149.0 lb

## 2013-09-07 DIAGNOSIS — Z8639 Personal history of other endocrine, nutritional and metabolic disease: Secondary | ICD-10-CM | POA: Insufficient documentation

## 2013-09-07 DIAGNOSIS — R5383 Other fatigue: Secondary | ICD-10-CM | POA: Insufficient documentation

## 2013-09-07 DIAGNOSIS — R5381 Other malaise: Secondary | ICD-10-CM

## 2013-09-07 DIAGNOSIS — Z862 Personal history of diseases of the blood and blood-forming organs and certain disorders involving the immune mechanism: Secondary | ICD-10-CM

## 2013-09-07 NOTE — Progress Notes (Signed)
Pre visit review using our clinic review tool, if applicable. No additional management support is needed unless otherwise documented below in the visit note. 

## 2013-09-07 NOTE — Progress Notes (Signed)
Patient ID: Kristin Sellers, female   DOB: [birth year], 28 y.o.   MRN: [id number]   Subjective:    Patient ID: Kristin Sellers, female    DOB: [birth year], 28 y.o.   MRN: [id number]  HPI  Kristin Sellers is a very pleasant 28 y/o female who presents to clinic to establish care. 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 Reports hx of elevated lipids and strong family hx of same. She was on lipitor 10 mg for a while but was discontinued when she had plans to start a family. Now that she is done with childbearing, she would like to have this monitored.  Also reports hx of arrhythmia and was on metoprolol for a while. Discontinued for same reason stated above. She believes the arrhythmia had a lot to do with stress. 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 Since changing jobs she feels everything has gone back to normal. Does not feel any irregular heart beats.  Has been feeling fatigued. Attributes this to having 2 small children but wants to make sure she does not have anemia or other deficiency causing her symptoms. She knows that exercise would probably help her and she is planning on slowly starting a routine she can stick with.  She has not had an annual physical in several years. The only doctor she has been seeing regularly has been her OB/GYN.    Past Medical History  Diagnosis Date  . Palpitations   . Hyperlipidemia   . Asthma   . Allergy   . Heart murmur   . History of Bell's palsy      Past Surgical History  Procedure Laterality Date  . Mouth surgery    . Tonsillectomy and adenoidectomy       Family History  Problem Relation Age of Onset  . Coronary artery disease    . Hypertension    . Heart attack    . Heart disease Mother   . Hyperlipidemia Mother   . Heart disease Father   . Hyperlipidemia Father   . Hypertension Father   . Anesthesia problems Neg Hx   . Hypotension Neg Hx   . Malignant hyperthermia Neg Hx   .  Pseudochol deficiency Neg Hx      History   Social History  . Marital Status: Married    Spouse Name: N/A    Number of Children: 2  . Years of Education: N/A   Occupational History  . RN     [employer]  .     Social History Main Topics  . Smoking status: Never Smoker   . Smokeless tobacco: Never Used  . Alcohol Use: No     Comment: occasional glass of wine when not pregnant  . Drug Use: No  . Sexual Activity: Yes    Birth Control/ Protection: None   Other Topics Concern  . Not on file   Social History Narrative  . No narrative on file    Health Maintenance:  PAP - 08/25/13 - Kristin Sellers in [place] - results normal  Tobacco Use - None   Dental Exams - Every 6 months. Last visit 03/2013  Vision Exam - No problems currently. Has not had a recent vision exam  Exercise - None at present.   Diet - Eats healthy.    Review of Systems  Constitutional: Negative.   HENT: Negative.   Eyes: Negative.   Respiratory:  Negative.   Cardiovascular: Negative.   Gastrointestinal: Negative.   Endocrine: Negative.   Genitourinary: Negative.   Musculoskeletal: Negative.   Skin: Negative.   Allergic/Immunologic: Negative.   Neurological: Negative.   Hematological: Negative.   Psychiatric/Behavioral: Negative.        Objective:  BP 125/79  Pulse 87  Temp(Src) 97.9 F (36.6 C) (Oral)  Resp 14  Ht 5' 2.5" (1.588 m)  Wt 149 lb (67.586 kg)  BMI 26.80 kg/m2  SpO2 98%  Breastfeeding? No   Physical Exam  Constitutional: She is oriented to person, place, and time. No distress.  HENT:  Head: Normocephalic and atraumatic.  Eyes: Conjunctivae and EOM are normal.  Neck: Normal range of motion. Neck supple.  Cardiovascular: Normal rate, regular rhythm, normal heart sounds and intact distal pulses.  Exam reveals no gallop and no friction rub.   No murmur heard. Pulmonary/Chest: Effort normal and breath sounds normal. No respiratory distress. She has no wheezes.  She has no rales.  Musculoskeletal: Normal range of motion.  Neurological: She is alert and oriented to person, place, and time. She has normal reflexes. Coordination normal.  Skin: Skin is warm and dry.  Psychiatric: She has a normal mood and affect. Her behavior is normal. Judgment and thought content normal.      Assessment & Plan:   1. History of elevated lipids She is not fasting today. Will have her return for fasting lipids.   - Lipid panel; Future  2. Other fatigue Check labs. Suspect that work/family life contributing to her symptoms but will r/o clinical reason for her symptoms.  - CBC with Differential; Future - TSH; Future - Vitamin B12; Future - Vitamin D (25 hydroxy); Future

## 2013-09-09 ENCOUNTER — Telehealth: Payer: Self-pay | Admitting: Adult Health

## 2013-09-09 ENCOUNTER — Other Ambulatory Visit (INDEPENDENT_AMBULATORY_CARE_PROVIDER_SITE_OTHER): Payer: PRIVATE HEALTH INSURANCE

## 2013-09-09 DIAGNOSIS — R5381 Other malaise: Secondary | ICD-10-CM

## 2013-09-09 DIAGNOSIS — Z8639 Personal history of other endocrine, nutritional and metabolic disease: Secondary | ICD-10-CM

## 2013-09-09 DIAGNOSIS — Z862 Personal history of diseases of the blood and blood-forming organs and certain disorders involving the immune mechanism: Secondary | ICD-10-CM

## 2013-09-09 DIAGNOSIS — R5383 Other fatigue: Secondary | ICD-10-CM

## 2013-09-09 LAB — CBC WITH DIFFERENTIAL/PLATELET
BASOS PCT: 0.3 % (ref 0.0–3.0)
Basophils Absolute: 0 10*3/uL (ref 0.0–0.1)
EOS PCT: 2.5 % (ref 0.0–5.0)
Eosinophils Absolute: 0.2 10*3/uL (ref 0.0–0.7)
HEMATOCRIT: 38.9 % (ref 36.0–46.0)
HEMOGLOBIN: 13.1 g/dL (ref 12.0–15.0)
LYMPHS ABS: 3 10*3/uL (ref 0.7–4.0)
LYMPHS PCT: 44.6 % (ref 12.0–46.0)
MCHC: 33.6 g/dL (ref 30.0–36.0)
MCV: 94.2 fl (ref 78.0–100.0)
MONOS PCT: 6.3 % (ref 3.0–12.0)
Monocytes Absolute: 0.4 10*3/uL (ref 0.1–1.0)
NEUTROS ABS: 3.1 10*3/uL (ref 1.4–7.7)
Neutrophils Relative %: 46.3 % (ref 43.0–77.0)
Platelets: 256 10*3/uL (ref 150.0–400.0)
RBC: 4.14 Mil/uL (ref 3.87–5.11)
RDW: 13.3 % (ref 11.5–15.5)
WBC: 6.7 10*3/uL (ref 4.0–10.5)

## 2013-09-09 LAB — LIPID PANEL
CHOL/HDL RATIO: 5
Cholesterol: 195 mg/dL (ref 0–200)
HDL: 38.4 mg/dL — AB (ref >39.00)
LDL Cholesterol: 145 mg/dL — ABNORMAL HIGH (ref 0–99)
NONHDL: 156.6
Triglycerides: 59 mg/dL (ref 0.0–149.0)
VLDL: 11.8 mg/dL (ref 0.0–40.0)

## 2013-09-09 LAB — VITAMIN B12: Vitamin B-12: 463 pg/mL (ref 211–911)

## 2013-09-09 LAB — VITAMIN D 25 HYDROXY (VIT D DEFICIENCY, FRACTURES): VITD: 20.27 ng/mL — AB (ref 30.00–100.00)

## 2013-09-09 LAB — TSH: TSH: 2.69 u[IU]/mL (ref 0.35–4.50)

## 2013-09-09 MED ORDER — ERGOCALCIFEROL 1.25 MG (50000 UT) PO CAPS: 50000.0000 [IU] | ORAL_CAPSULE | ORAL | Status: DC

## 2013-09-09 NOTE — Telephone Encounter (Signed)
Spoke with patient to notify her of Raquel's comments. Patient verbalized understand. She stated that she will try a healthy diet and exercise before going back on the statin medication.

## 2013-09-09 NOTE — Telephone Encounter (Signed)
Labs show elevated LDL (145) which should be below 100. Other cholesterol was good. HDL is slightly low. If you exercise regularly and eat healthy I think the LDL will come down. If you want me to restart your statin let me know.  Vit D is low. I will send in Drisdol 50,000 units to pharmacy. 1 capsule weekly x 12 weeks then we repeat.

## 2013-10-14 ENCOUNTER — Encounter: Payer: PRIVATE HEALTH INSURANCE | Admitting: Internal Medicine

## 2013-11-29 ENCOUNTER — Ambulatory Visit (INDEPENDENT_AMBULATORY_CARE_PROVIDER_SITE_OTHER): Payer: PRIVATE HEALTH INSURANCE | Admitting: Internal Medicine

## 2013-11-29 ENCOUNTER — Encounter: Payer: Self-pay | Admitting: Internal Medicine

## 2013-11-29 VITALS — BP 116/68 | HR 84 | Temp 97.5°F | Resp 16 | Ht 63.75 in | Wt 159.0 lb

## 2013-11-29 DIAGNOSIS — Z0000 Encounter for general adult medical examination without abnormal findings: Secondary | ICD-10-CM | POA: Insufficient documentation

## 2013-11-29 DIAGNOSIS — Z79899 Other long term (current) drug therapy: Secondary | ICD-10-CM

## 2013-11-29 DIAGNOSIS — E559 Vitamin D deficiency, unspecified: Secondary | ICD-10-CM | POA: Insufficient documentation

## 2013-11-29 DIAGNOSIS — E663 Overweight: Secondary | ICD-10-CM | POA: Insufficient documentation

## 2013-11-29 LAB — COMPREHENSIVE METABOLIC PANEL
ALK PHOS: 63 U/L (ref 39–117)
ALT: 12 U/L (ref 0–35)
AST: 18 U/L (ref 0–37)
Albumin: 3.4 g/dL — ABNORMAL LOW (ref 3.5–5.2)
BILIRUBIN TOTAL: 0.4 mg/dL (ref 0.2–1.2)
BUN: 9 mg/dL (ref 6–23)
CHLORIDE: 105 meq/L (ref 96–112)
CO2: 24 mEq/L (ref 19–32)
CREATININE: 0.7 mg/dL (ref 0.4–1.2)
Calcium: 9.1 mg/dL (ref 8.4–10.5)
GFR: 106.44 mL/min (ref >60.00)
Glucose, Bld: 111 mg/dL — ABNORMAL HIGH (ref 70–99)
POTASSIUM: 3.7 meq/L (ref 3.5–5.1)
SODIUM: 135 meq/L (ref 135–145)
Total Protein: 8.2 g/dL (ref 6.0–8.3)

## 2013-11-29 NOTE — Assessment & Plan Note (Signed)
I have addressed  BMI and recommended wt loss of 10% of body weigh over the next 6 months using a low glycemic index diet and regular exercise a minimum of 5 days per week.   

## 2013-11-29 NOTE — Patient Instructions (Addendum)
This is  my version of a  "Low GI"  Diet:  It will still lower your blood sugars and allow you to lose 4 to 8  lbs  per month if you follow it carefully.  Your goal with exercise is a minimum of 30 minutes of aerobic exercise 5 days per week (Walking does not count once it becomes easy!)     All of the foods can be found at grocery stores and in bulk at [employer].  The Atkins protein bars and shakes are available in more varieties at [employer], [employer] and Center.     7 AM Breakfast:  Choose from the following:  Low carbohydrate Protein  Shakes (I recommend the EAS AdvantEdge "Carb Control" shakes  Or the low carb shakes by Atkins.    2.5 carbs   Arnold's "Sandwhich Thin"toasted  w/ peanut butter (no jelly: about 20 net carbs  "Bagel Thin" with cream cheese and salmon: about 20 carbs   a scrambled egg/bacon/cheese burrito made with Mission's "carb balance" whole wheat tortilla  (about 10 net carbs )  A slice of home made fritatta (egg based dish without a crust:  google it)    Avoid cereal and bananas, oatmeal and cream of wheat and grits. They are loaded with carbohydrates!   10 AM: high protein snack  Protein bar by Atkins (the snack size, under 200 cal, usually < 6 net carbs).    A stick of cheese:  Around 1 carb,  100 cal     Dannon Light n Fit [place] Yogurt  (80 cal, 8 carbs)  Other so called "protein bars" and Greek yogurts tend to be loaded with carbohydrates.  Remember, in food advertising, the word "energy" is synonymous for " carbohydrate."  Lunch:   A Sandwich using the bread choices listed, Can use any  Eggs,  lunchmeat, grilled meat or canned tuna), avocado, regular mayo/mustard  and cheese.  A Salad using blue cheese, ranch,  Goddess or vinagrette,  No croutons or "confetti" and no "candied nuts" but regular nuts OK.   No pretzels or chips.  Pickles and miniature sweet peppers are a good low carb alternative that provide a "crunch"  The bread is the only source of  carbohydrate in a sandwich and  can be decreased by trying some of these alternatives to traditional loaf bread  Joseph's makes a pita bread and a flat bread that are 50 cal and 4 net carbs available at [place] and [place].  This can be toasted to use with hummous as well  Toufayan makes a low carb flatbread that's 100 cal and 9 net carbs available at [employer] and [employer]J's makes 2 sizes of  Low carb whole wheat tortilla  (The large one is 210 cal and 6 net carbs)  Flat Out makes flatbreads that are low carb as well  Avoid "Low fat dressings, as well as Barry Brunner and Edcouch dressings They are loaded with sugar!   3 PM/ Mid day  Snack:  Consider  1 ounce of  almonds, walnuts, pistachios, pecans, peanuts,  Macadamia nuts or a nut medley.  Avoid "granola"; the dried cranberries and raisins are loaded with carbohydrates. Mixed nuts as long as there are no raisins,  cranberries or dried fruit.    Try the prosciutto/mozzarella cheese sticks by Fiorruci  In deli /backery section   High protein   To avoid overindulging in snacks: Try drinking a glass of unsweeted almond/coconut milk  Or a cup of coffee with your Atkins chocolate bar to keep you from having 3!!!   Pork rinds!  Yes Pork Rinds        6 PM  Dinner:     Meat/fowl/fish with a green salad, and either broccoli, cauliflower, green beans, spinach, brussel sprouts or  Lima beans. DO NOT BREAD THE PROTEIN!!      There is a low carb pasta by Dreamfield's that is acceptable and tastes great: only 5 digestible carbs/serving.( All grocery stores but BJs carry it )  Try Kai LevinsMichel Angelo's chicken piccata or chicken or eggplant parm over low carb pasta.(Lowes and BJs)   Clifton CustardAaron Sanchez's "Carnitas" (pulled pork, no sauce,  0 carbs) or his beef pot roast to make a dinner burrito (at BJ's)  Pesto over low carb pasta (bj's sells a good quality pesto in the center refrigerated section of the deli   Try satueeing  Roosvelt HarpsBok Choy with mushroooms  Whole  wheat pasta is still full of digestible carbs and  Not as low in glycemic index as Dreamfield's.   Brown rice is still rice,  So skip the rice and noodles if you eat Congohinese or New Zealandhai (or at least limit to 1/2 cup)  9 PM snack :   Breyer's "low carb" fudgsicle or  ice cream bar (Carb Smart line), or  Weight Watcher's ice cream bar , or another "no sugar added" ice cream;  a serving of fresh berries/cherries with whipped cream   Cheese or DANNON'S LlGHT N FIT GREEK YOGURT or the Oikos greek yogurt   8 ounces of [place] unsweetened almond/cococunut milk    Avoid bananas, pineapple, grapes  and watermelon on a regular basis because they are high in sugar.  THINK OF THEM AS DESSERT  Remember that snack Substitutions should be less than 10 NET carbs per serving and meals < 20 carbs. Remember to subtract fiber grams to get the "net carbs."prev

## 2013-11-29 NOTE — Progress Notes (Signed)
Pre-visit discussion using our clinic review tool. No additional management support is needed unless otherwise documented below in the visit note.  

## 2013-11-29 NOTE — Progress Notes (Signed)
Patient ID: Kristin Sellers, female   DOB: 1985/05/07, 28 y.o.   MRN: [id number]     Subjective:     Kristin Sellers is a 28 y.o. female and is here for a comprehensive physical exam. The patient reports no problems. 2 young children   Works at the [employer]  [place] [facility] [place]  PAP was normal July 2015 no history of abnormals.  IUD removed 2 weeks ago due to irregular menses which  never resolve,d   Conceived last child while on  OCPS and breast feeding. [name].   So swtiched to IUD for the last 2 years.  Now back on OCps,  No longer breastfeed ing  Weight gain.  Lost wieght during an office contest,  But has gained it back.  Not exercising regularly currently.     History   Social History  . Marital Status: Married    Spouse Name: N/A    Number of Children: 2  . Years of Education: N/A   Occupational History  . RN     [employer]  .     Social History Main Topics  . Smoking status: Never Smoker   . Smokeless tobacco: Never Used  . Alcohol Use: No     Comment: occasional glass of wine when not pregnant  . Drug Use: No  . Sexual Activity: Yes    Birth Control/ Protection: None   Other Topics Concern  . Not on file   Social History Narrative  . No narrative on file   Health Maintenance  Topic Date Due  . Tetanus/tdap  04/14/2003  . Influenza Vaccine  09/17/2013  . Pap Smear  08/23/2016    The following portions of the patient's history were reviewed and updated as appropriate: allergies, current medications, past family history, past medical history, past social history, past surgical history and problem list.  Review of Systems A comprehensive review of systems was negative.   Objective:   BP 116/68  Pulse 84  Temp(Src) 97.5 F (36.4 C) (Oral)  Resp 16  Ht 5' 3.75" (1.619 m)  Wt 159 lb (72.122 kg)  BMI 27.52 kg/m2  SpO2 98%  General appearance: alert, cooperative and appears stated age Ears: normal TM's and external ear canals both  ears Throat: lips, mucosa, and tongue normal; teeth and gums normal Neck: no adenopathy, no carotid bruit, supple, symmetrical, trachea midline and thyroid not enlarged, symmetric, no tenderness/mass/nodules Back: symmetric, no curvature. ROM normal. No CVA tenderness. Lungs: clear to auscultation bilaterally Heart: regular rate and rhythm, S1, S2 normal, no murmur, click, rub or gallop Abdomen: soft, non-tender; bowel sounds normal; no masses,  no organomegaly Pulses: 2+ and symmetric Skin: Skin color, texture, turgor normal. No rashes or lesions Lymph nodes: Cervical, supraclavicular, and axillary nodes normal.  .    Assessment and plan:   Visit for preventive health examination Annual comprehensive exam was done excluding breast, excluding pelvic and PAP smear. All screenings have been addressed and a printed health maintenance schedule was given to patient.    Overweight I have addressed  BMI and recommended wt loss of 10% of body weigh over the next 6 months using a low glycemic index diet and regular exercise a minimum of 5 days per week.    Long-term use of high-risk medication Hepatic function panel is normal on OCPS.  Lab Results  Component Value Date   ALT 12 11/29/2013   AST 18 11/29/2013   ALKPHOS 63  11/29/2013   BILITOT 0.4 11/29/2013       Updated Medication List Outpatient Encounter Prescriptions as of 11/29/2013  Medication Sig  . ergocalciferol (DRISDOL) 50000 UNITS capsule Take 1 capsule (50,000 Units total) by mouth once a week. Take once weekly for 12 weeks  . Norgestimate-Ethinyl Estradiol Triphasic (ORTHO TRI-CYCLEN, 28,) 0.18/0.215/0.25 MG-35 MCG tablet Take 1 tablet by mouth daily.  . [DISCONTINUED] levonorgestrel (MIRENA) 20 MCG/24HR IUD 1 each by Intrauterine route once.

## 2013-11-29 NOTE — Assessment & Plan Note (Signed)
Annual comprehensive exam was done excluding breast, excluding pelvic and PAP smear. All screenings have been addressed and a printed health maintenance schedule was given to patient.   

## 2013-11-29 NOTE — Assessment & Plan Note (Signed)
Hepatic function panel is normal on OCPS.  Lab Results  Component Value Date   ALT 12 11/29/2013   AST 18 11/29/2013   ALKPHOS 63 11/29/2013   BILITOT 0.4 11/29/2013

## 2013-11-30 ENCOUNTER — Encounter: Payer: Self-pay | Admitting: *Deleted

## 2013-12-19 ENCOUNTER — Encounter: Payer: Self-pay | Admitting: Internal Medicine

## 2014-01-31 ENCOUNTER — Ambulatory Visit: Payer: Self-pay | Admitting: Internal Medicine

## 2014-05-23 ENCOUNTER — Encounter: Payer: Self-pay | Admitting: *Deleted

## 2014-06-28 ENCOUNTER — Other Ambulatory Visit: Payer: Self-pay | Admitting: Orthopedic Surgery

## 2014-06-28 DIAGNOSIS — R531 Weakness: Secondary | ICD-10-CM

## 2014-06-28 DIAGNOSIS — R52 Pain, unspecified: Secondary | ICD-10-CM

## 2014-07-14 ENCOUNTER — Ambulatory Visit
Admission: RE | Admit: 2014-07-14 | Discharge: 2014-07-14 | Disposition: A | Discharge status: Home / Self Care | Payer: PRIVATE HEALTH INSURANCE | Source: Ambulatory Visit | Attending: Orthopedic Surgery | Admitting: Orthopedic Surgery

## 2014-07-14 DIAGNOSIS — R52 Pain, unspecified: Secondary | ICD-10-CM

## 2014-07-14 DIAGNOSIS — R531 Weakness: Secondary | ICD-10-CM

## 2014-07-14 MED ORDER — IOHEXOL 180 MG/ML  SOLN
15.0000 mL | Freq: Once | INTRAMUSCULAR | Status: AC | PRN
  Administered 2014-07-14: 15 mL via INTRA_ARTICULAR

## 2014-07-18 DIAGNOSIS — S4292XD Fracture of left shoulder girdle, part unspecified, subsequent encounter for fracture with routine healing: Secondary | ICD-10-CM | POA: Insufficient documentation

## 2014-12-04 ENCOUNTER — Ambulatory Visit (INDEPENDENT_AMBULATORY_CARE_PROVIDER_SITE_OTHER): Payer: PRIVATE HEALTH INSURANCE | Admitting: Internal Medicine

## 2014-12-04 ENCOUNTER — Encounter: Payer: Self-pay | Admitting: Internal Medicine

## 2014-12-04 VITALS — BP 118/68 | HR 86 | Temp 98.1°F | Wt 156.1 lb

## 2014-12-04 DIAGNOSIS — E785 Hyperlipidemia, unspecified: Secondary | ICD-10-CM

## 2014-12-04 DIAGNOSIS — R5383 Other fatigue: Secondary | ICD-10-CM | POA: Diagnosis not present

## 2014-12-04 DIAGNOSIS — Z79899 Other long term (current) drug therapy: Secondary | ICD-10-CM

## 2014-12-04 DIAGNOSIS — Z113 Encounter for screening for infections with a predominantly sexual mode of transmission: Secondary | ICD-10-CM

## 2014-12-04 DIAGNOSIS — F411 Generalized anxiety disorder: Secondary | ICD-10-CM

## 2014-12-04 DIAGNOSIS — Z0000 Encounter for general adult medical examination without abnormal findings: Secondary | ICD-10-CM

## 2014-12-04 DIAGNOSIS — E663 Overweight: Secondary | ICD-10-CM

## 2014-12-04 DIAGNOSIS — E559 Vitamin D deficiency, unspecified: Secondary | ICD-10-CM | POA: Diagnosis not present

## 2014-12-04 LAB — COMPREHENSIVE METABOLIC PANEL
ALT: 11 U/L (ref 0–35)
AST: 15 U/L (ref 0–37)
Albumin: 3.9 g/dL (ref 3.5–5.2)
Alkaline Phosphatase: 88 U/L (ref 39–117)
BILIRUBIN TOTAL: 0.4 mg/dL (ref 0.2–1.2)
BUN: 12 mg/dL (ref 6–23)
CO2: 29 mEq/L (ref 19–32)
CREATININE: 0.7 mg/dL (ref 0.40–1.20)
Calcium: 9.3 mg/dL (ref 8.4–10.5)
Chloride: 104 mEq/L (ref 96–112)
GFR: 103.98 mL/min (ref >60.00)
GLUCOSE: 81 mg/dL (ref 70–99)
Potassium: 4.4 mEq/L (ref 3.5–5.1)
Sodium: 141 mEq/L (ref 135–145)
Total Protein: 7.4 g/dL (ref 6.0–8.3)

## 2014-12-04 LAB — VITAMIN D 25 HYDROXY (VIT D DEFICIENCY, FRACTURES): VITD: 26.93 ng/mL — ABNORMAL LOW (ref 30.00–100.00)

## 2014-12-04 MED ORDER — ALPRAZOLAM 0.25 MG PO TABS: 0.2500 mg | ORAL_TABLET | Freq: Every evening | ORAL | Status: DC | PRN

## 2014-12-04 NOTE — Patient Instructions (Signed)
It was good to see you!!  There are MANY diets that will work, but the most effective ones are those that can become lifestyle changes and include aerobic exercise  3 to 5 days a week to achieve and maintain your goal weight.   The  diet I discussed with you today is the 10 day Green Smoothie Cleansing /Detox Diet by Linden Dolin . available on [place] for around $10.  This is not a low carb or a weight loss diet,  It is fundamentally a "cleansing" low fat diet that eliminates sugar, gluten, caffeine, alcohol and dairy for 10 days .  What you add back after the initial ten days is entirely up to  you!  You can expect to lose 5 to 10 lbs depending on how strict you are.   I suggest drinking 2 smoothies daily and keeping one chewable meal (but keep it simple, like baked fish and salad, rice or bok choy) .  You snack primarily on fresh  fruit, egg whites and judicious quantities of nuts. You can add vegetable based protein powder (nothing with whey , since whey is dairy) in it.  [employer] has a [employer] officer, political party .  It does require some form of a nutrient extractor (Vita Mix, a electric juicer,  Or a Nutribullet Rx).  i have found that using frozen fruits is much more convenient and cost effective. You can even find plenty of organic fruit in the frozen fruit section of [employer].  Just thaw what you need for the following day the night before in the refrigerator (to avoid jamming up your juicer/blender)   I have given you a prescripiton of low dose  alprazolam to help manage your  stress .  If you find yourself needing to use it daily,  We should consider a safer daily alternative that is not habit forming.  (like celexa,  Lexapro,  buspar or zoloft)   Health Maintenance, Female Adopting a healthy lifestyle and getting preventive care can go a long way to promote health and wellness. Talk with your health care provider about what schedule of regular examinations is right for you. This is a good chance for you to  check in with your provider about disease prevention and staying healthy. In between checkups, there are plenty of things you can do on your own. Experts have done a lot of research about which lifestyle changes and preventive measures are most likely to keep you healthy. Ask your health care provider for more information. WEIGHT AND DIET  Eat a healthy diet  Be sure to include plenty of vegetables, fruits, low-fat dairy products, and lean protein.  Do not eat a lot of foods high in solid fats, added sugars, or salt.  Get regular exercise. This is one of the most important things you can do for your health.  Most adults should exercise for at least 150 minutes each week. The exercise should increase your heart rate and make you sweat (moderate-intensity exercise).  Most adults should also do strengthening exercises at least twice a week. This is in addition to the moderate-intensity exercise.  Maintain a healthy weight  Body mass index (BMI) is a measurement that can be used to identify possible weight problems. It estimates body fat based on height and weight. Your health care provider can help determine your BMI and help you achieve or maintain a healthy weight.  For females 1 years of age and older:   A BMI below 18.5 is considered underweight.  A BMI of 18.5 to 24.9 is normal.  A BMI of 25 to 29.9 is considered overweight.  A BMI of 30 and above is considered obese.  Watch levels of cholesterol and blood lipids  You should start having your blood tested for lipids and cholesterol at 30 years of age, then have this test every 5 years.  You may need to have your cholesterol levels checked more often if:  Your lipid or cholesterol levels are high.  You are older than 30 years of age.  You are at high risk for heart disease.  CANCER SCREENING   Lung Cancer  Lung cancer screening is recommended for adults 69-27 years old who are at high risk for lung cancer because of a  history of smoking.  A yearly low-dose CT scan of the lungs is recommended for people who:  Currently smoke.  Have quit within the past 15 years.  Have at least a 30-pack-year history of smoking. A pack year is smoking an average of one pack of cigarettes a day for 1 year.  Yearly screening should continue until it has been 15 years since you quit.  Yearly screening should stop if you develop a health problem that would prevent you from having lung cancer treatment.  Breast Cancer  Practice breast self-awareness. This means understanding how your breasts normally appear and feel.  It also means doing regular breast self-exams. Let your health care provider know about any changes, no matter how small.  If you are in your 20s or 30s, you should have a clinical breast exam (CBE) by a health care provider every 1-3 years as part of a regular health exam.  If you are 60 or older, have a CBE every year. Also consider having a breast X-ray (mammogram) every year.  If you have a family history of breast cancer, talk to your health care provider about genetic screening.  If you are at high risk for breast cancer, talk to your health care provider about having an MRI and a mammogram every year.  Breast cancer gene (BRCA) assessment is recommended for women who have family members with BRCA-related cancers. BRCA-related cancers include:  Breast.  Ovarian.  Tubal.  Peritoneal cancers.  Results of the assessment will determine the need for genetic counseling and BRCA1 and BRCA2 testing. Cervical Cancer Your health care provider may recommend that you be screened regularly for cancer of the pelvic organs (ovaries, uterus, and vagina). This screening involves a pelvic examination, including checking for microscopic changes to the surface of your cervix (Pap test). You may be encouraged to have this screening done every 3 years, beginning at age 35.  For women ages 15-65, health care  providers may recommend pelvic exams and Pap testing every 3 years, or they may recommend the Pap and pelvic exam, combined with testing for human papilloma virus (HPV), every 5 years. Some types of HPV increase your risk of cervical cancer. Testing for HPV may also be done on women of any age with unclear Pap test results.  Other health care providers may not recommend any screening for nonpregnant women who are considered low risk for pelvic cancer and who do not have symptoms. Ask your health care provider if a screening pelvic exam is right for you.  If you have had past treatment for cervical cancer or a condition that could lead to cancer, you need Pap tests and screening for cancer for at least 20 years after your treatment. If Pap tests  have been discontinued, your risk factors (such as having a new sexual partner) need to be reassessed to determine if screening should resume. Some women have medical problems that increase the chance of getting cervical cancer. In these cases, your health care provider may recommend more frequent screening and Pap tests. Colorectal Cancer  This type of cancer can be detected and often prevented.  Routine colorectal cancer screening usually begins at 30 years of age and continues through 30 years of age.  Your health care provider may recommend screening at an earlier age if you have risk factors for colon cancer.  Your health care provider may also recommend using home test kits to check for hidden blood in the stool.  A small camera at the end of a tube can be used to examine your colon directly (sigmoidoscopy or colonoscopy). This is done to check for the earliest forms of colorectal cancer.  Routine screening usually begins at age 62.  Direct examination of the colon should be repeated every 5-10 years through 30 years of age. However, you may need to be screened more often if early forms of precancerous polyps or small growths are found. Skin  Cancer  Check your skin from head to toe regularly.  Tell your health care provider about any new moles or changes in moles, especially if there is a change in a mole's shape or color.  Also tell your health care provider if you have a mole that is larger than the size of a pencil eraser.  Always use sunscreen. Apply sunscreen liberally and repeatedly throughout the day.  Protect yourself by wearing long sleeves, pants, a wide-brimmed hat, and sunglasses whenever you are outside. HEART DISEASE, DIABETES, AND HIGH BLOOD PRESSURE   High blood pressure causes heart disease and increases the risk of stroke. High blood pressure is more likely to develop in:  People who have blood pressure in the high end of the normal range (130-139/85-89 mm Hg).  People who are overweight or obese.  People who are African American.  If you are 45-22 years of age, have your blood pressure checked every 3-5 years. If you are 69 years of age or older, have your blood pressure checked every year. You should have your blood pressure measured twice--once when you are at a hospital or clinic, and once when you are not at a hospital or clinic. Record the average of the two measurements. To check your blood pressure when you are not at a hospital or clinic, you can use:  An automated blood pressure machine at a pharmacy.  A home blood pressure monitor.  If you are between 24 years and 19 years old, ask your health care provider if you should take aspirin to prevent strokes.  Have regular diabetes screenings. This involves taking a blood sample to check your fasting blood sugar level.  If you are at a normal weight and have a low risk for diabetes, have this test once every three years after 30 years of age.  If you are overweight and have a high risk for diabetes, consider being tested at a younger age or more often. PREVENTING INFECTION  Hepatitis B  If you have a higher risk for hepatitis B, you should be  screened for this virus. You are considered at high risk for hepatitis B if:  You were born in a country where hepatitis B is common. Ask your health care provider which countries are considered high risk.  Your parents were born  in a high-risk country, and you have not been immunized against hepatitis B (hepatitis B vaccine).  You have HIV or AIDS.  You use needles to inject street drugs.  You live with someone who has hepatitis B.  You have had sex with someone who has hepatitis B.  You get hemodialysis treatment.  You take certain medicines for conditions, including cancer, organ transplantation, and autoimmune conditions. Hepatitis C  Blood testing is recommended for:  Everyone born from [birth year] through 1966.  Anyone with known risk factors for hepatitis C. Sexually transmitted infections (STIs)  You should be screened for sexually transmitted infections (STIs) including gonorrhea and chlamydia if:  You are sexually active and are younger than 30 years of age.  You are older than 30 years of age and your health care provider tells you that you are at risk for this type of infection.  Your sexual activity has changed since you were last screened and you are at an increased risk for chlamydia or gonorrhea. Ask your health care provider if you are at risk.  If you do not have HIV, but are at risk, it may be recommended that you take a prescription medicine daily to prevent HIV infection. This is called pre-exposure prophylaxis (PrEP). You are considered at risk if:  You are sexually active and do not regularly use condoms or know the HIV status of your partner(s).  You take drugs by injection.  You are sexually active with a partner who has HIV. Talk with your health care provider about whether you are at high risk of being infected with HIV. If you choose to begin PrEP, you should first be tested for HIV. You should then be tested every 3 months for as long as you are taking  PrEP.  PREGNANCY   If you are premenopausal and you may become pregnant, ask your health care provider about preconception counseling.  If you may become pregnant, take 400 to 800 micrograms (mcg) of folic acid every day.  If you want to prevent pregnancy, talk to your health care provider about birth control (contraception). OSTEOPOROSIS AND MENOPAUSE   Osteoporosis is a disease in which the bones lose minerals and strength with aging. This can result in serious bone fractures. Your risk for osteoporosis can be identified using a bone density scan.  If you are 50 years of age or older, or if you are at risk for osteoporosis and fractures, ask your health care provider if you should be screened.  Ask your health care provider whether you should take a calcium or vitamin D supplement to lower your risk for osteoporosis.  Menopause may have certain physical symptoms and risks.  Hormone replacement therapy may reduce some of these symptoms and risks. Talk to your health care provider about whether hormone replacement therapy is right for you.  HOME CARE INSTRUCTIONS   Schedule regular health, dental, and eye exams.  Stay current with your immunizations.   Do not use any tobacco products including cigarettes, chewing tobacco, or electronic cigarettes.  If you are pregnant, do not drink alcohol.  If you are breastfeeding, limit how much and how often you drink alcohol.  Limit alcohol intake to no more than 1 drink per day for nonpregnant women. One drink equals 12 ounces of beer, 5 ounces of wine, or 1 ounces of hard liquor.  Do not use street drugs.  Do not share needles.  Ask your health care provider for help if you need support or information  about quitting drugs.  Tell your health care provider if you often feel depressed.  Tell your health care provider if you have ever been abused or do not feel safe at home.   This information is not intended to replace advice given  to you by your health care provider. Make sure you discuss any questions you have with your health care provider.   Document Released: 08/19/2010 Document Revised: 02/24/2014 Document Reviewed: 01/05/2013 Elsevier Interactive Patient Education [employer].

## 2014-12-04 NOTE — Progress Notes (Signed)
Patient ID: Kristin Sellers, female    DOB: [birth year]  Age: 29 y.o. MRN: [id number]  The patient is here for annual  wellness examination and management of other chronic and acute problems.   She has been taking Vit D3 1000 units daily since she finished the megadose  in 2015.    The risk factors are reflected in the social history.  The roster of all physicians providing medical care to patient - is listed in the Snapshot section of the chart.   Home safety : The patient has smoke detectors in the home. They wear seatbelts.  There are no firearms at home. There is no violence in the home.   There is no risks for hepatitis, STDs or HIV. There is no   history of blood transfusion. They have no travel history to infectious disease endemic areas of the world.  The patient has seen their dentist in the last six month. They have seen their eye doctor in the last year. They do not  have excessive sun exposure. Discussed the need for sun protection: hats, long sleeves and use of sunscreen if there is significant sun exposure.   Diet: the importance of a healthy diet is discussed. They do have a healthy diet.  The benefits of regular aerobic exercise were discussed. She walks 4 times per week ,  20 minutes.   Depression screen: there are no signs or vegative symptoms of depression- irritability, change in appetite, anhedonia, sadness/tearfullness.   The following portions of the patient's history were reviewed and updated as appropriate: allergies, current medications, past family history, past medical history,  past surgical history, past social history  and problem list.  Visual acuity was not assessed per patient preference since she has regular follow up with her ophthalmologist. Hearing and body mass index were assessed and reviewed.   During the course of the visit the patient was educated and counseled about appropriate screening and preventive services including : fall prevention ,  diabetes screening, nutrition counseling, colorectal cancer screening, and recommended immunizations.    CC: The primary encounter diagnosis was Anxiety state. Diagnoses of Screen for STD (sexually transmitted disease), Vitamin D deficiency, Other fatigue, Dyslipidemia, Visit for preventive health examination, Overweight, and Long-term use of high-risk medication were also pertinent to this visit.  History Kristin Sellers has a past medical history of Palpitations; Hyperlipidemia; Asthma; Allergy; Heart murmur; and History of Bell's palsy.   She has past surgical history that includes Mouth surgery and Tonsillectomy and adenoidectomy.   Her family history includes Coronary artery disease in her father and another family member; Early death in her paternal grandfather; Heart attack in her father and another family member; Heart disease in her father and mother; Hyperlipidemia in her father and mother; Hypertension in her father and another family member. There is no history of Anesthesia problems, Hypotension, Malignant hyperthermia, or Pseudochol deficiency.She reports that she has never smoked. She has never used smokeless tobacco. She reports that she does not drink alcohol or use illicit drugs.  Outpatient Prescriptions Prior to Visit  Medication Sig Dispense Refill  . ergocalciferol (DRISDOL) 50000 UNITS capsule Take 1 capsule (50,000 Units total) by mouth once a week. Take once weekly for 12 weeks 4 capsule 2  . Norgestimate-Ethinyl Estradiol Triphasic (ORTHO TRI-CYCLEN, 28,) 0.18/0.215/0.25 MG-35 MCG tablet Take 1 tablet by mouth daily.     No facility-administered medications prior to visit.    Review of Systems   Patient denies headache, fevers, malaise, unintentional weight  loss, skin rash, eye pain, sinus congestion and sinus pain, sore throat, dysphagia,  hemoptysis , cough, dyspnea, wheezing, chest pain, palpitations, orthopnea, edema, abdominal pain, nausea, melena, diarrhea, constipation,  flank pain, dysuria, hematuria, urinary  Frequency, nocturia, numbness, tingling, seizures,  Focal weakness, Loss of consciousness,  Tremor, insomnia, depression, anxiety, and suicidal ideation.      Objective:  BP 118/68 mmHg  Pulse 86  Temp(Src) 98.1 F (36.7 C) (Oral)  Wt 156 lb 1.9 oz (70.816 kg)  SpO2 99%  LMP 11/16/2014  Physical Exam   General appearance: alert, cooperative and appears stated age Head: Normocephalic, without obvious abnormality, atraumatic Eyes: conjunctivae/corneas clear. PERRL, EOM's intact. Fundi benign. Ears: normal TM's and external ear canals both ears Nose: Nares normal. Septum midline. Mucosa normal. No drainage or sinus tenderness. Throat: lips, mucosa, and tongue normal; teeth and gums normal Neck: no adenopathy, no carotid bruit, no JVD, supple, symmetrical, trachea midline and thyroid not enlarged, symmetric, no tenderness/mass/nodules Lungs: clear to auscultation bilaterally Breasts: normal appearance, no masses or tenderness Heart: regular rate and rhythm, S1, S2 normal, no murmur, click, rub or gallop Abdomen: soft, non-tender; bowel sounds normal; no masses,  no organomegaly Extremities: extremities normal, atraumatic, no cyanosis or edema Pulses: 2+ and symmetric Skin: Skin color, texture, turgor normal. No rashes or lesions Neurologic: Alert and oriented X 3, normal strength and tone. Normal symmetric reflexes. Normal coordination and gait.  .  Assessment & Plan:   Problem List Items Addressed This Visit    Vitamin D deficiency    Repeat level pending. Continue 1000 IUs daily for now.       Relevant Orders   Vit D  25 hydroxy (rtn osteoporosis monitoring) (Completed)   Overweight    I have addressed  BMI and recommended continued weight loss of 10% of body weight over the next 6 months using a low glycemic index diet and regular exercise a minimum of 5 days per week.        Visit for preventive health examination    Annual  wellness  exam was done as well as a comprehensive physical exam  .  During the course of the visit the patient was educated and counseled about appropriate screening and preventive services and screenings were brought up to date for cervical and breast cancer .  She will return for fasting labs to provide samples for diabetes screening and lipid analysis with projected  10 year  risk for CAD. nutrition counseling, skin cancer screening has been recommended, along with review of the age appropriate recommended immunizations.  Printed recommendations for health maintenance screenings was given.        Long-term use of high-risk medication    Hepatic function panel is normal on [facility].  Lab Results  Component Value Date   ALT 11 12/04/2014   AST 15 12/04/2014   ALKPHOS 88 12/04/2014   BILITOT 0.4 12/04/2014           Anxiety state - Primary    Aggravated by her toddler's pulmonary condition .  Alprazolam rx given for prn use  The risks and benefits of benzodiazepine use were discussed with patient today including excessive sedation leading to respiratory depression,  impaired thinking/driving, and addiction.  Patient was advised to avoid concurrent use with alcohol, to use medication only as needed and not to share with others  .        Relevant Medications   ALPRAZolam (XANAX) 0.25 MG tablet   Other fatigue  Relevant Orders   CBC with Differential/Platelet   Comprehensive metabolic panel (Completed)   TSH    Other Visit Diagnoses    Screen for STD (sexually transmitted disease)        Relevant Orders    Hepatitis C antibody (Completed)    Dyslipidemia        Relevant Orders    Lipid panel       I am having Kristin Sellers start on ALPRAZolam. I am also having her maintain her ergocalciferol and Norgestimate-Ethinyl Estradiol Triphasic.  Meds ordered this encounter  Medications  . ALPRAZolam (XANAX) 0.25 MG tablet    Sig: Take 1 tablet (0.25 mg total) by mouth at bedtime as  needed for anxiety.    Dispense:  30 tablet    Refill:  0    There are no discontinued medications.  Follow-up: No Follow-up on file.   Sherlene ShamsULLO, Samari Gorby L, MD

## 2014-12-05 ENCOUNTER — Encounter: Payer: Self-pay | Admitting: Internal Medicine

## 2014-12-05 LAB — HEPATITIS C ANTIBODY: HCV AB: NEGATIVE

## 2014-12-05 NOTE — Assessment & Plan Note (Signed)
Hepatic function panel is normal on [facility].  Lab Results  Component Value Date   ALT 11 12/04/2014   AST 15 12/04/2014   ALKPHOS 88 12/04/2014   BILITOT 0.4 12/04/2014

## 2014-12-05 NOTE — Assessment & Plan Note (Signed)
Repeat level pending. Continue 1000 IUs daily for now.

## 2014-12-05 NOTE — Assessment & Plan Note (Signed)
I have addressed  BMI and recommended continued weight loss of 10% of body weight over the next 6 months using a low glycemic index diet and regular exercise a minimum of 5 days per week.

## 2014-12-05 NOTE — Assessment & Plan Note (Signed)

## 2014-12-05 NOTE — Assessment & Plan Note (Signed)
Aggravated by her toddler's pulmonary condition .  Alprazolam rx given for prn use  The risks and benefits of benzodiazepine use were discussed with patient today including excessive sedation leading to respiratory depression,  impaired thinking/driving, and addiction.  Patient was advised to avoid concurrent use with alcohol, to use medication only as needed and not to share with others  .

## 2014-12-08 ENCOUNTER — Encounter: Payer: Self-pay | Admitting: *Deleted

## 2014-12-27 ENCOUNTER — Ambulatory Visit: Payer: Self-pay | Admitting: Physician Assistant

## 2015-01-03 ENCOUNTER — Other Ambulatory Visit: Payer: Self-pay | Admitting: Obstetrics and Gynecology

## 2015-01-04 LAB — CYTOLOGY - PAP

## 2015-04-18 ENCOUNTER — Ambulatory Visit: Payer: Self-pay | Admitting: Family

## 2015-04-18 ENCOUNTER — Encounter: Payer: Self-pay | Admitting: Physician Assistant

## 2015-04-18 VITALS — BP 110/80 | HR 80 | Temp 98.8°F

## 2015-04-18 DIAGNOSIS — J111 Influenza due to unidentified influenza virus with other respiratory manifestations: Secondary | ICD-10-CM

## 2015-04-18 LAB — POCT INFLUENZA A/B: Influenza A, POC: NEGATIVE

## 2015-04-18 NOTE — Progress Notes (Signed)
See note in paper chart by Heather Ratcliffe, PAC  

## 2015-08-20 ENCOUNTER — Other Ambulatory Visit: Payer: Self-pay | Admitting: Obstetrics and Gynecology

## 2015-09-06 LAB — OB RESULTS CONSOLE RPR: RPR: NONREACTIVE

## 2015-09-06 LAB — OB RESULTS CONSOLE RUBELLA ANTIBODY, IGM: Rubella: IMMUNE

## 2015-09-06 LAB — OB RESULTS CONSOLE GC/CHLAMYDIA
CHLAMYDIA, DNA PROBE: NEGATIVE
GC PROBE AMP, GENITAL: NEGATIVE

## 2015-09-06 LAB — OB RESULTS CONSOLE ANTIBODY SCREEN: ANTIBODY SCREEN: NEGATIVE

## 2015-09-06 LAB — OB RESULTS CONSOLE ABO/RH: RH Type: POSITIVE

## 2015-09-06 LAB — OB RESULTS CONSOLE HEPATITIS B SURFACE ANTIGEN: Hepatitis B Surface Ag: NEGATIVE

## 2015-09-06 LAB — OB RESULTS CONSOLE HIV ANTIBODY (ROUTINE TESTING): HIV: NONREACTIVE

## 2015-10-08 ENCOUNTER — Inpatient Hospital Stay (HOSPITAL_COMMUNITY)
Admission: AD | Admit: 2015-10-08 | Discharge: 2015-10-08 | Disposition: A | Discharge status: Home / Self Care | Payer: PRIVATE HEALTH INSURANCE | Source: Ambulatory Visit | Attending: Obstetrics & Gynecology | Admitting: Obstetrics & Gynecology

## 2015-10-08 ENCOUNTER — Encounter (HOSPITAL_COMMUNITY): Payer: Self-pay | Admitting: *Deleted

## 2015-10-08 DIAGNOSIS — R059 Cough, unspecified: Secondary | ICD-10-CM

## 2015-10-08 DIAGNOSIS — J018 Other acute sinusitis: Secondary | ICD-10-CM | POA: Diagnosis not present

## 2015-10-08 DIAGNOSIS — Z3A12 12 weeks gestation of pregnancy: Secondary | ICD-10-CM | POA: Insufficient documentation

## 2015-10-08 DIAGNOSIS — J019 Acute sinusitis, unspecified: Secondary | ICD-10-CM | POA: Insufficient documentation

## 2015-10-08 DIAGNOSIS — O26891 Other specified pregnancy related conditions, first trimester: Secondary | ICD-10-CM | POA: Insufficient documentation

## 2015-10-08 DIAGNOSIS — R05 Cough: Secondary | ICD-10-CM | POA: Insufficient documentation

## 2015-10-08 DIAGNOSIS — R509 Fever, unspecified: Secondary | ICD-10-CM | POA: Diagnosis present

## 2015-10-08 DIAGNOSIS — R300 Dysuria: Secondary | ICD-10-CM | POA: Diagnosis present

## 2015-10-08 LAB — URINALYSIS, ROUTINE W REFLEX MICROSCOPIC
Bilirubin Urine: NEGATIVE
Glucose, UA: NEGATIVE mg/dL
Hgb urine dipstick: NEGATIVE
KETONES UR: NEGATIVE mg/dL
Leukocytes, UA: NEGATIVE
NITRITE: NEGATIVE
Protein, ur: NEGATIVE mg/dL
Specific Gravity, Urine: 1.01 (ref 1.005–1.030)
pH: 6 (ref 5.0–8.0)

## 2015-10-08 LAB — CBC WITH DIFFERENTIAL/PLATELET
BASOS PCT: 0 %
Basophils Absolute: 0 10*3/uL (ref 0.0–0.1)
EOS ABS: 0.1 10*3/uL (ref 0.0–0.7)
Eosinophils Relative: 2 %
HCT: 36 % (ref 36.0–46.0)
HEMOGLOBIN: 12.7 g/dL (ref 12.0–15.0)
Lymphocytes Relative: 25 %
Lymphs Abs: 2 10*3/uL (ref 0.7–4.0)
MCH: 31.5 pg (ref 26.0–34.0)
MCHC: 35.3 g/dL (ref 30.0–36.0)
MCV: 89.3 fL (ref 78.0–100.0)
MONOS PCT: 4 %
Monocytes Absolute: 0.3 10*3/uL (ref 0.1–1.0)
NEUTROS PCT: 69 %
Neutro Abs: 5.6 10*3/uL (ref 1.7–7.7)
PLATELETS: 291 10*3/uL (ref 150–400)
RBC: 4.03 MIL/uL (ref 3.87–5.11)
RDW: 13 % (ref 11.5–15.5)
WBC: 8.1 10*3/uL (ref 4.0–10.5)

## 2015-10-08 LAB — INFLUENZA PANEL BY PCR (TYPE A & B)
H1N1 flu by pcr: NOT DETECTED
INFLAPCR: NEGATIVE
INFLBPCR: NEGATIVE

## 2015-10-08 MED ORDER — AMOXICILLIN-POT CLAVULANATE 875-125 MG PO TABS: 1.0000 | ORAL_TABLET | Freq: Two times a day (BID) | ORAL | 0 refills | Status: DC

## 2015-10-08 MED ORDER — BENZONATATE 100 MG PO CAPS: 100.0000 mg | ORAL_CAPSULE | Freq: Three times a day (TID) | ORAL | 0 refills | Status: DC

## 2015-10-08 MED ORDER — HYDROCODONE-GUAIFENESIN 2.5-200 MG/5ML PO SOLN: 5.0000 mL | Freq: Every evening | ORAL | 0 refills | Status: DC | PRN

## 2015-10-08 NOTE — MAU Provider Note (Signed)
History     CSN: [id number]  Arrival date and time: 10/08/15 [id number]   None     Chief Complaint  Patient presents with  . Dysuria  . Fever  . Emesis During Pregnancy   HPI   [name] is a 30 y.o. female 663P2002 @ 3356w5d here with fever, nausea and congestion. She was recently prescribed antibiotics for URI from her OB office in the last week and completed the course of keflex.  2 weeks ago she called the office and told them she was experiencing  URI symptoms.  Dr. Dareen PianoAnderson called in keflex. She took the keflex and completed the course. She was feeling better and then on Saturday her symptoms worsened. On Saturday the  Cough, and runny nose returned . She also started running a fever yesterday; she reports 101.3 temp.  The cough is keeping her up at night.  When she coughs she is bringing up thick, yellow sputum. She feels her symptoms are worse in her head and not in her chest. She denies chest pain.   No one else in the house is sick, her kids are in daycare she reports.   At 0830 she took tylenol for fever. Fever this morning prior to tylenol was 100.5  OB History    Gravida Para Term Preterm AB Living   3 2 2  0 0 2   SAB TAB Ectopic Multiple Live Births   0 0 0 0 2      Past Medical History:  Diagnosis Date  . Allergy   . Asthma   . Heart murmur   . History of Bell's palsy   . Hyperlipidemia   . Palpitations     Past Surgical History:  Procedure Laterality Date  . MOUTH SURGERY    . TONSILLECTOMY AND ADENOIDECTOMY      Family History  Problem Relation Age of Onset  . Heart disease Mother   . Hyperlipidemia Mother   . Heart disease Father   . Hyperlipidemia Father   . Hypertension Father   . Coronary artery disease Father   . Heart attack Father   . Early death Paternal Grandfather   . Coronary artery disease    . Hypertension    . Heart attack    . Anesthesia problems Neg Hx   . Hypotension Neg Hx   . Malignant hyperthermia Neg Hx   .  Pseudochol deficiency Neg Hx     Social History  Substance Use Topics  . Smoking status: Never Smoker  . Smokeless tobacco: Never Used  . Alcohol use No     Comment: occasional glass of wine when not pregnant    Allergies:  Allergies  Allergen Reactions  . Sulfa Antibiotics Hives    Prescriptions Prior to Admission  Medication Sig Dispense Refill Last Dose  . acetaminophen (TYLENOL) 325 MG tablet Take 650 mg by mouth every 6 (six) hours as needed for mild pain or fever.   10/08/2015 at 0830  . aspirin EC 81 MG tablet Take 81 mg by mouth at bedtime.   10/06/2015  . Prenatal Vit-Fe Fumarate-FA (PRENATAL MULTIVITAMIN) TABS tablet Take 1 tablet by mouth at bedtime.   10/06/2015  . promethazine (PHENERGAN) 25 MG tablet Take 25 mg by mouth every 6 (six) hours as needed for nausea or vomiting.   10/07/2015 at Unknown time   Results for orders placed or performed during the hospital encounter of 10/08/15 (from the past 48 hour(s))  Urinalysis, Routine w reflex microscopic (  not [facility])     Status: None   Collection Time: 10/08/15 10:20 AM  Result Value Ref Range   Color, Urine YELLOW YELLOW   APPearance CLEAR CLEAR   Specific Gravity, Urine 1.010 1.005 - 1.030   pH 6.0 5.0 - 8.0   Glucose, UA NEGATIVE NEGATIVE mg/dL   Hgb urine dipstick NEGATIVE NEGATIVE   Bilirubin Urine NEGATIVE NEGATIVE   Ketones, ur NEGATIVE NEGATIVE mg/dL   Protein, ur NEGATIVE NEGATIVE mg/dL   Nitrite NEGATIVE NEGATIVE   Leukocytes, UA NEGATIVE NEGATIVE    Comment: MICROSCOPIC NOT DONE ON URINES WITH NEGATIVE PROTEIN, BLOOD, LEUKOCYTES, NITRITE, OR GLUCOSE <1000 mg/dL.  CBC with Differential     Status: None   Collection Time: 10/08/15 12:28 PM  Result Value Ref Range   WBC 8.1 4.0 - 10.5 K/uL   RBC 4.03 3.87 - 5.11 MIL/uL   Hemoglobin 12.7 12.0 - 15.0 g/dL   HCT 16.136.0 09.636.0 - 04.546.0 %   MCV 89.3 78.0 - 100.0 fL   MCH 31.5 26.0 - 34.0 pg   MCHC 35.3 30.0 - 36.0 g/dL   RDW 40.913.0 81.111.5 - 91.415.5 %   Platelets 291  150 - 400 K/uL   Neutrophils Relative % 69 %   Neutro Abs 5.6 1.7 - 7.7 K/uL   Lymphocytes Relative 25 %   Lymphs Abs 2.0 0.7 - 4.0 K/uL   Monocytes Relative 4 %   Monocytes Absolute 0.3 0.1 - 1.0 K/uL   Eosinophils Relative 2 %   Eosinophils Absolute 0.1 0.0 - 0.7 K/uL   Basophils Relative 0 %   Basophils Absolute 0.0 0.0 - 0.1 K/uL  Influenza panel by PCR (type A & B, H1N1)     Status: None   Collection Time: 10/08/15 12:44 PM  Result Value Ref Range   Influenza A By PCR NEGATIVE NEGATIVE   Influenza B By PCR NEGATIVE NEGATIVE   H1N1 flu by pcr NOT DETECTED NOT DETECTED    Comment:        The Xpert Flu assay (FDA approved for nasal aspirates or washes and nasopharyngeal swab specimens), is intended as an aid in the diagnosis of influenza and should not be used as a sole basis for treatment. [facility]    Review of Systems  Constitutional: Positive for fever.  HENT: Positive for congestion (Head congestion ). Negative for sore throat.   Respiratory: Positive for cough and sputum production. Negative for shortness of breath and wheezing.   Gastrointestinal: Positive for nausea and vomiting.  Genitourinary: Positive for dysuria. Negative for flank pain.   Physical Exam   Blood pressure 125/83, pulse 102, temperature 98.2 F (36.8 C), temperature source Oral, resp. rate 18, last menstrual period 11/16/2014.  Physical Exam  Constitutional: She is oriented to person, place, and time. She appears well-developed and well-nourished. No distress.  HENT:  Head: Normocephalic.  Eyes: Pupils are equal, round, and reactive to light.  Neck: Neck supple.  Cardiovascular: Normal rate.   Respiratory: Effort normal and breath sounds normal. No respiratory distress. She has no wheezes. She has no rales. She exhibits no tenderness.  Musculoskeletal: Normal range of motion.  Neurological: She is alert and oriented to person, place, and time.  Skin: Skin is warm. She  is not diaphoretic.    MAU Course  Procedures  None  MDM + fetal hear tones  CBC with diff.  Influenza swab pending  Discussed patient with Dr. Mora ApplPinn.   Assessment and Plan  A:  1. Other acute sinusitis   2. Cough     P:  Discharge home in stable condition  Rx: hydrocodone cough, tesslon pearls, Augmentin Discussed possible GI upset; increase yogurt if possible Return to MAU if symptoms worsen I will call the patient with influenza results Ok to take tylenol as needed, if symptoms worsen    Duane Lope, NP 10/08/2015 7:44 PM

## 2015-10-08 NOTE — Discharge Instructions (Signed)
Cool Mist Vaporizers Vaporizers may help relieve the symptoms of a cough and cold. They add moisture to the air, which helps mucus to become thinner and less sticky. This makes it easier to breathe and cough up secretions. Cool mist vaporizers do not cause serious burns like hot mist vaporizers, which may also be called steamers or humidifiers. Vaporizers have not been proven to help with colds. You should not use a vaporizer if you are allergic to mold. HOME CARE INSTRUCTIONS  Follow the package instructions for the vaporizer.  Do not use anything other than distilled water in the vaporizer.  Do not run the vaporizer all of the time. This can cause mold or bacteria to grow in the vaporizer.  Clean the vaporizer after each time it is used.  Clean and dry the vaporizer well before storing it.  Stop using the vaporizer if worsening respiratory symptoms develop.   This information is not intended to replace advice given to you by your health care provider. Make sure you discuss any questions you have with your health care provider.   Document Released: 11/01/2003 Document Revised: 02/08/2013 Document Reviewed: 06/23/2012 Elsevier Interactive Patient Education 2016 Elsevier Inc.  Sinusitis, Adult Sinusitis is redness, soreness, and inflammation of the paranasal sinuses. Paranasal sinuses are air pockets within the bones of your face. They are located beneath your eyes, in the middle of your forehead, and above your eyes. In healthy paranasal sinuses, mucus is able to drain out, and air is able to circulate through them by way of your nose. However, when your paranasal sinuses are inflamed, mucus and air can become trapped. This can allow bacteria and other germs to grow and cause infection. Sinusitis can develop quickly and last only a short time (acute) or continue over a long period (chronic). Sinusitis that lasts for more than 12 weeks is considered chronic. CAUSES Causes of sinusitis  include:  Allergies.  Structural abnormalities, such as displacement of the cartilage that separates your nostrils (deviated septum), which can decrease the air flow through your nose and sinuses and affect sinus drainage.  Functional abnormalities, such as when the small hairs (cilia) that line your sinuses and help remove mucus do not work properly or are not present. SIGNS AND SYMPTOMS Symptoms of acute and chronic sinusitis are the same. The primary symptoms are pain and pressure around the affected sinuses. Other symptoms include:  Upper toothache.  Earache.  Headache.  Bad breath.  Decreased sense of smell and taste.  A cough, which worsens when you are lying flat.  Fatigue.  Fever.  Thick drainage from your nose, which often is green and may contain pus (purulent).  Swelling and warmth over the affected sinuses. DIAGNOSIS Your health care provider will perform a physical exam. During your exam, your health care provider may perform any of the following to help determine if you have acute sinusitis or chronic sinusitis:  Look in your nose for signs of abnormal growths in your nostrils (nasal polyps).  Tap over the affected sinus to check for signs of infection.  View the inside of your sinuses using an imaging device that has a light attached (endoscope). If your health care provider suspects that you have chronic sinusitis, one or more of the following tests may be recommended:  Allergy tests.  Nasal culture. A sample of mucus is taken from your nose, sent to a lab, and screened for bacteria.  Nasal cytology. A sample of mucus is taken from your nose and examined  by your health care provider to determine if your sinusitis is related to an allergy. TREATMENT Most cases of acute sinusitis are related to a viral infection and will resolve on their own within 10 days. Sometimes, medicines are prescribed to help relieve symptoms of both acute and chronic sinusitis.  These may include pain medicines, decongestants, nasal steroid sprays, or saline sprays. However, for sinusitis related to a bacterial infection, your health care provider will prescribe antibiotic medicines. These are medicines that will help kill the bacteria causing the infection. Rarely, sinusitis is caused by a fungal infection. In these cases, your health care provider will prescribe antifungal medicine. For some cases of chronic sinusitis, surgery is needed. Generally, these are cases in which sinusitis recurs more than 3 times per year, despite other treatments. HOME CARE INSTRUCTIONS  Drink plenty of water. Water helps thin the mucus so your sinuses can drain more easily.  Use a humidifier.  Inhale steam 3-4 times a day (for example, sit in the bathroom with the shower running).  Apply a warm, moist washcloth to your face 3-4 times a day, or as directed by your health care provider.  Use saline nasal sprays to help moisten and clean your sinuses.  Take medicines only as directed by your health care provider.  If you were prescribed either an antibiotic or antifungal medicine, finish it all even if you start to feel better. SEEK IMMEDIATE MEDICAL CARE IF:  You have increasing pain or severe headaches.  You have nausea, vomiting, or drowsiness.  You have swelling around your face.  You have vision problems.  You have a stiff neck.  You have difficulty breathing.   This information is not intended to replace advice given to you by your health care provider. Make sure you discuss any questions you have with your health care provider.   Document Released: 02/03/2005 Document Revised: 02/24/2014 Document Reviewed: 02/18/2011 Elsevier Interactive Patient Education [employer]2016 Elsevier Inc.

## 2015-10-08 NOTE — MAU Note (Signed)
Pt states she had a fever of 101.3 yesterday, woke up with dysuria, unable to void very much, has urgency.  Has been vomiting since Saturday, denies diarrhea.  Just finished keflex last Thursday for URI.  Advised to come to MAU by MD office.  Temp was 100.5 this a.m., took tylenol around 0830.

## 2015-10-09 LAB — CULTURE, OB URINE: Culture: >=100000 — AB

## 2015-10-19 DIAGNOSIS — J40 Bronchitis, not specified as acute or chronic: Secondary | ICD-10-CM

## 2015-10-19 HISTORY — DX: Bronchitis, not specified as acute or chronic: J40

## 2015-10-30 ENCOUNTER — Inpatient Hospital Stay (HOSPITAL_COMMUNITY)
Admission: AD | Admit: 2015-10-30 | Discharge: 2015-10-30 | Disposition: A | Discharge status: Home / Self Care | Payer: PRIVATE HEALTH INSURANCE | Source: Ambulatory Visit | Attending: Obstetrics and Gynecology | Admitting: Obstetrics and Gynecology

## 2015-10-30 ENCOUNTER — Encounter (HOSPITAL_COMMUNITY): Payer: Self-pay | Admitting: *Deleted

## 2015-10-30 DIAGNOSIS — O99612 Diseases of the digestive system complicating pregnancy, second trimester: Secondary | ICD-10-CM | POA: Insufficient documentation

## 2015-10-30 DIAGNOSIS — O219 Vomiting of pregnancy, unspecified: Secondary | ICD-10-CM

## 2015-10-30 DIAGNOSIS — O2342 Unspecified infection of urinary tract in pregnancy, second trimester: Secondary | ICD-10-CM | POA: Insufficient documentation

## 2015-10-30 DIAGNOSIS — A084 Viral intestinal infection, unspecified: Secondary | ICD-10-CM

## 2015-10-30 DIAGNOSIS — Z3A15 15 weeks gestation of pregnancy: Secondary | ICD-10-CM | POA: Diagnosis not present

## 2015-10-30 DIAGNOSIS — R197 Diarrhea, unspecified: Secondary | ICD-10-CM | POA: Diagnosis present

## 2015-10-30 LAB — URINE MICROSCOPIC-ADD ON: RBC / HPF: NONE SEEN RBC/hpf (ref 0–5)

## 2015-10-30 LAB — CBC
HCT: 36.8 % (ref 36.0–46.0)
Hemoglobin: 13 g/dL (ref 12.0–15.0)
MCH: 31.7 pg (ref 26.0–34.0)
MCHC: 35.3 g/dL (ref 30.0–36.0)
MCV: 89.8 fL (ref 78.0–100.0)
PLATELETS: 292 10*3/uL (ref 150–400)
RBC: 4.1 MIL/uL (ref 3.87–5.11)
RDW: 13.4 % (ref 11.5–15.5)
WBC: 6.3 10*3/uL (ref 4.0–10.5)

## 2015-10-30 LAB — URINALYSIS, ROUTINE W REFLEX MICROSCOPIC
Bilirubin Urine: NEGATIVE
Glucose, UA: NEGATIVE mg/dL
Hgb urine dipstick: NEGATIVE
Ketones, ur: 15 mg/dL — AB
Leukocytes, UA: NEGATIVE
NITRITE: POSITIVE — AB
Protein, ur: NEGATIVE mg/dL
pH: 6 (ref 5.0–8.0)

## 2015-10-30 LAB — COMPREHENSIVE METABOLIC PANEL
ALT: 19 U/L (ref 14–54)
ANION GAP: 11 (ref 5–15)
AST: 27 U/L (ref 15–41)
Albumin: 3.3 g/dL — ABNORMAL LOW (ref 3.5–5.0)
Alkaline Phosphatase: 103 U/L (ref 38–126)
BUN: 10 mg/dL (ref 6–20)
CO2: 20 mmol/L — AB (ref 22–32)
Calcium: 8.6 mg/dL — ABNORMAL LOW (ref 8.9–10.3)
Chloride: 104 mmol/L (ref 101–111)
Creatinine, Ser: 0.65 mg/dL (ref 0.44–1.00)
GFR calc Af Amer: >60 mL/min (ref >60)
GFR calc non Af Amer: >60 mL/min (ref >60)
Glucose, Bld: 115 mg/dL — ABNORMAL HIGH (ref 65–99)
POTASSIUM: 3.4 mmol/L — AB (ref 3.5–5.1)
SODIUM: 135 mmol/L (ref 135–145)
Total Bilirubin: 0.6 mg/dL (ref 0.3–1.2)
Total Protein: 7.7 g/dL (ref 6.5–8.1)

## 2015-10-30 MED ORDER — METOCLOPRAMIDE HCL 5 MG/ML IJ SOLN
10.0000 mg | Freq: Once | INTRAMUSCULAR | Status: AC
  Administered 2015-10-30: 10 mg via INTRAVENOUS
  Filled 2015-10-30: qty 2

## 2015-10-30 MED ORDER — METOCLOPRAMIDE HCL 10 MG PO TABS: 10.0000 mg | ORAL_TABLET | Freq: Four times a day (QID) | ORAL | 0 refills | Status: DC | PRN

## 2015-10-30 MED ORDER — ONDANSETRON HCL 4 MG/2ML IJ SOLN
4.0000 mg | INTRAMUSCULAR | Status: AC
  Administered 2015-10-30: 4 mg via INTRAVENOUS
  Filled 2015-10-30: qty 2

## 2015-10-30 MED ORDER — LACTATED RINGERS IV BOLUS (SEPSIS)
1000.0000 mL | Freq: Once | INTRAVENOUS | Status: AC
  Administered 2015-10-30: 1000 mL via INTRAVENOUS

## 2015-10-30 MED ORDER — ONDANSETRON HCL 4 MG PO TABS: 4.0000 mg | ORAL_TABLET | Freq: Three times a day (TID) | ORAL | 0 refills | Status: DC | PRN

## 2015-10-30 MED ORDER — DEXTROSE 5 % IN LACTATED RINGERS IV BOLUS
1000.0000 mL | Freq: Once | INTRAVENOUS | Status: AC
  Administered 2015-10-30: 1000 mL via INTRAVENOUS

## 2015-10-30 MED ORDER — CEFTRIAXONE SODIUM 1 G IJ SOLR
1.0000 g | Freq: Once | INTRAMUSCULAR | Status: AC
  Administered 2015-10-30: 1 g via INTRAMUSCULAR
  Filled 2015-10-30: qty 10

## 2015-10-30 NOTE — MAU Provider Note (Signed)
Chief Complaint: Emesis and Diarrhea   First Provider Initiated Contact with Patient 10/30/15 1340      SUBJECTIVE HPI: Kristin Sellers is a 30 y.o. G3P2002 at [redacted]w[redacted]d by LMP who presents to maternity admissions reporting onset of diarrhea then nausea and vomiting last night at 6 pm, continuing all night and into today. She cannot keep down any food or fluids. She reports diarrhea x 10 or more and voming 5-6 times in 24 hours.  She tried to take Phenergan at home but could not keep it down.  She called her OB office and was told to come to MAU for further evaluation.  She reports her daughter was sick and vomited x 1 last week but did not have any severe symptoms.  Her symptoms are constant and unchanged since onset.  Nothing makes them better or worse.  She is starting to have body aches and fatigue today associated with her vomiting. She denies abdominal cramping, vaginal bleeding, vaginal itching/burning, urinary symptoms, h/a, dizziness, or fever/chills.     HPI  Past Medical History:  Diagnosis Date  . Allergy   . Asthma   . Heart murmur   . History of Bell's palsy   . Hyperlipidemia   . Palpitations    Past Surgical History:  Procedure Laterality Date  . MOUTH SURGERY    . TONSILLECTOMY AND ADENOIDECTOMY     Social History   Social History  . Marital status: Married    Spouse name: N/A  . Number of children: 2  . Years of education: N/A   Occupational History  . RN     [employer]  .  [employer]   Social History Main Topics  . Smoking status: Never Smoker  . Smokeless tobacco: Never Used  . Alcohol use No     Comment: occasional glass of wine when not pregnant  . Drug use: No  . Sexual activity: Yes    Birth control/ protection: None   Other Topics Concern  . Not on file   Social History Narrative  . No narrative on file   No current facility-administered medications on file prior to encounter.    Current Outpatient  Prescriptions on File Prior to Encounter  Medication Sig Dispense Refill  . acetaminophen (TYLENOL) 325 MG tablet Take 650 mg by mouth every 6 (six) hours as needed for mild pain or fever.    [name] aspirin EC 81 MG tablet Take 81 mg by mouth at bedtime.    . Prenatal Vit-Fe Fumarate-FA (PRENATAL MULTIVITAMIN) TABS tablet Take 1 tablet by mouth at bedtime.    . promethazine (PHENERGAN) 25 MG tablet Take 25 mg by mouth every 6 (six) hours as needed for nausea or vomiting.    [name] amoxicillin-clavulanate (AUGMENTIN) 875-125 MG tablet Take 1 tablet by mouth every 12 (twelve) hours. 14 tablet 0  . benzonatate (TESSALON) 100 MG capsule Take 1 capsule (100 mg total) by mouth every 8 (eight) hours. 21 capsule 0  . HYDROcodone-GuaiFENesin 2.5-200 MG/5ML SOLN Take 5 mLs by mouth at bedtime as needed. 118 mL 0   Allergies  Allergen Reactions  . Sulfa Antibiotics Hives    ROS:  Review of Systems  Constitutional: Positive for fatigue. Negative for chills and fever.  Eyes: Negative for visual disturbance.  Respiratory: Negative for shortness of breath.   Cardiovascular: Negative for chest pain.  Gastrointestinal: Positive for diarrhea, nausea and vomiting. Negative for abdominal pain.  Genitourinary: Negative for difficulty urinating,  dysuria, flank pain, pelvic pain, vaginal bleeding, vaginal discharge and vaginal pain.  Musculoskeletal: Positive for myalgias.  Neurological: Negative for dizziness and headaches.  Psychiatric/Behavioral: Negative.      I have reviewed patient's Past Medical Hx, Surgical Hx, Family Hx, Social Hx, medications and allergies.   Physical Exam   Patient Vitals for the past 24 hrs:  BP Temp Temp src Pulse Resp Height Weight  10/30/15 1228 122/83 98.4 F (36.9 C) Oral (!) 122 18 5\' 2"  (1.575 m) 160 lb (72.6 kg)   Constitutional: Well-developed, well-nourished female in no acute distress.  Cardiovascular: normal rate Respiratory: normal effort GI: Abd soft, non-tender.  Pos BS x 4 MS: Extremities nontender, no edema, normal ROM Neurologic: Alert and oriented x 4.  GU: Neg CVAT.  FHT 163 by doppler  LAB RESULTS Results for orders placed or performed during the hospital encounter of 10/30/15 (from the past 24 hour(s))  Urinalysis, Routine w reflex microscopic (not [facility])     Status: Abnormal   Collection Time: 10/30/15  1:23 PM  Result Value Ref Range   Color, Urine YELLOW YELLOW   APPearance CLOUDY (A) CLEAR   Specific Gravity, Urine >1.030 (H) 1.005 - 1.030   pH 6.0 5.0 - 8.0   Glucose, UA NEGATIVE NEGATIVE mg/dL   Hgb urine dipstick NEGATIVE NEGATIVE   Bilirubin Urine NEGATIVE NEGATIVE   Ketones, ur 15 (A) NEGATIVE mg/dL   Protein, ur NEGATIVE NEGATIVE mg/dL   Nitrite POSITIVE (A) NEGATIVE   Leukocytes, UA NEGATIVE NEGATIVE  Urine microscopic-add on     Status: Abnormal   Collection Time: 10/30/15  1:23 PM  Result Value Ref Range   Squamous Epithelial / LPF 0-5 (A) NONE SEEN   WBC, UA 0-5 0 - 5 WBC/hpf   RBC / HPF NONE SEEN 0 - 5 RBC/hpf   Bacteria, UA MANY (A) NONE SEEN   Urine-Other AMORPHOUS URATES/PHOSPHATES   CBC     Status: None   Collection Time: 10/30/15  1:32 PM  Result Value Ref Range   WBC 6.3 4.0 - 10.5 K/uL   RBC 4.10 3.87 - 5.11 MIL/uL   Hemoglobin 13.0 12.0 - 15.0 g/dL   HCT 40.936.8 81.136.0 - 91.446.0 %   MCV 89.8 78.0 - 100.0 fL   MCH 31.7 26.0 - 34.0 pg   MCHC 35.3 30.0 - 36.0 g/dL   RDW 78.213.4 95.611.5 - 21.315.5 %   Platelets 292 150 - 400 K/uL  Comprehensive metabolic panel     Status: Abnormal   Collection Time: 10/30/15  1:32 PM  Result Value Ref Range   Sodium 135 135 - 145 mmol/L   Potassium 3.4 (L) 3.5 - 5.1 mmol/L   Chloride 104 101 - 111 mmol/L   CO2 20 (L) 22 - 32 mmol/L   Glucose, Bld 115 (H) 65 - 99 mg/dL   BUN 10 6 - 20 mg/dL   Creatinine, Ser 0.860.65 0.44 - 1.00 mg/dL   Calcium 8.6 (L) 8.9 - 10.3 mg/dL   Total Protein 7.7 6.5 - 8.1 g/dL   Albumin 3.3 (L) 3.5 - 5.0 g/dL   AST 27 15 - 41 U/L   ALT 19 14 - 54 U/L    Alkaline Phosphatase 103 38 - 126 U/L   Total Bilirubin 0.6 0.3 - 1.2 mg/dL   GFR calc non Af Amer >60 >60 mL/min   GFR calc Af Amer >60 >60 mL/min   Anion gap 11 5 - 15  IMAGING No results found.  MAU Management/MDM: Ordered labs and reviewed results.  D5LR x 1000 ml and LR x 1000 ml, Zofran 4 mg IV, Reglan 10 mg IV. Pt tolerated PO food and fluids in MAU.  Consult Dr Henderson Cloud.  D/C home with medications, f/u in office. Return to MAU if symptoms worsen or persist. Pt stable at time of discharge.  ASSESSMENT 1. Viral gastroenteritis   2. UTI (urinary tract infection) during pregnancy, second trimester   3. Nausea and vomiting during pregnancy prior to [redacted] weeks gestation     PLAN Discharge home   Medication List    STOP taking these medications   amoxicillin-clavulanate 875-125 MG tablet Commonly known as:  AUGMENTIN   benzonatate 100 MG capsule Commonly known as:  TESSALON   HYDROcodone-GuaiFENesin 2.5-200 MG/5ML Soln     TAKE these medications   acetaminophen 325 MG tablet Commonly known as:  TYLENOL Take 650 mg by mouth every 6 (six) hours as needed for mild pain or fever.   aspirin EC 81 MG tablet Take 81 mg by mouth at bedtime.   metoCLOPramide 10 MG tablet Commonly known as:  REGLAN Take 1 tablet (10 mg total) by mouth every 6 (six) hours as needed for nausea.   ondansetron 4 MG tablet Commonly known as:  ZOFRAN Take 1 tablet (4 mg total) by mouth every 8 (eight) hours as needed for nausea or vomiting.   prenatal multivitamin Tabs tablet Take 1 tablet by mouth at bedtime.   PROAIR HFA 108 (90 Base) MCG/ACT inhaler Generic drug:  albuterol INHALE 2 PUFF(S) EVERY 4 HOURS BY INHALATION ROUTE AS NEEDED.   promethazine 25 MG tablet Commonly known as:  PHENERGAN Take 25 mg by mouth every 6 (six) hours as needed for nausea or vomiting.      Follow-up Information    HORVATH,MICHELLE A, MD .   Specialty:  Obstetrics and Gynecology Why:  As  scheduled, return to MAU as needed for emergencies Contact information: [address] [place] [address] [phone]           Sharen Counter Certified Nurse-Midwife 10/30/2015  5:16 PM

## 2015-10-30 NOTE — Discharge Instructions (Signed)
Viral Gastroenteritis °Viral gastroenteritis is also known as stomach flu. This condition affects the stomach and intestinal tract. It can cause sudden diarrhea and vomiting. The illness typically lasts 3 to 8 days. Most people develop an immune response that eventually gets rid of the virus. While this natural response develops, the virus can make you quite ill. °CAUSES  °Many different viruses can cause gastroenteritis, such as rotavirus or noroviruses. You can catch one of these viruses by consuming contaminated food or water. You may also catch a virus by sharing utensils or other personal items with an infected person or by touching a contaminated surface. °SYMPTOMS  °The most common symptoms are diarrhea and vomiting. These problems can cause a severe loss of body fluids (dehydration) and a body salt (electrolyte) imbalance. Other symptoms may include: °· Fever. °· Headache. °· Fatigue. °· Abdominal pain. °DIAGNOSIS  °Your caregiver can usually diagnose viral gastroenteritis based on your symptoms and a physical exam. A stool sample may also be taken to test for the presence of viruses or other infections. °TREATMENT  °This illness typically goes away on its own. Treatments are aimed at rehydration. The most serious cases of viral gastroenteritis involve vomiting so severely that you are not able to keep fluids down. In these cases, fluids must be given through an intravenous line (IV). °HOME CARE INSTRUCTIONS  °· Drink enough fluids to keep your urine clear or pale yellow. Drink small amounts of fluids frequently and increase the amounts as tolerated. °· Ask your caregiver for specific rehydration instructions. °· Avoid: °¨ Foods high in sugar. °¨ Alcohol. °¨ Carbonated drinks. °¨ Tobacco. °¨ Juice. °¨ Caffeine drinks. °¨ Extremely hot or cold fluids. °¨ Fatty, greasy foods. °¨ Too much intake of anything at one time. °¨ Dairy products until 24 to 48 hours after diarrhea stops. °· You may consume probiotics.  Probiotics are active cultures of beneficial bacteria. They may lessen the amount and number of diarrheal stools in adults. Probiotics can be found in yogurt with active cultures and in supplements. °· Wash your hands well to avoid spreading the virus. °· Only take over-the-counter or prescription medicines for pain, discomfort, or fever as directed by your caregiver. Do not give aspirin to children. Antidiarrheal medicines are not recommended. °· Ask your caregiver if you should continue to take your regular prescribed and over-the-counter medicines. °· Keep all follow-up appointments as directed by your caregiver. °SEEK IMMEDIATE MEDICAL CARE IF:  °· You are unable to keep fluids down. °· You do not urinate at least once every 6 to 8 hours. °· You develop shortness of breath. °· You notice blood in your stool or vomit. This may look like coffee grounds. °· You have abdominal pain that increases or is concentrated in one small area (localized). °· You have persistent vomiting or diarrhea. °· You have a fever. °· The patient is a child younger than 3 months, and he or she has a fever. °· The patient is a child older than 3 months, and he or she has a fever and persistent symptoms. °· The patient is a child older than 3 months, and he or she has a fever and symptoms suddenly get worse. °· The patient is a baby, and he or she has no tears when crying. °MAKE SURE YOU:  °· Understand these instructions. °· Will watch your condition. °· Will get help right away if you are not doing well or get worse. °  °This information is not intended to replace   advice given to you by your health care provider. Make sure you discuss any questions you have with your health care provider.   Document Released: 02/03/2005 Document Revised: 04/28/2011 Document Reviewed: 11/20/2010 Elsevier Interactive Patient Education 2016 [employer]  Pregnancy and Urinary Tract Infection A urinary tract infection (UTI) is a bacterial infection  of the urinary tract. Infection of the urinary tract can include the ureters, kidneys (pyelonephritis), bladder (cystitis), and urethra (urethritis). All pregnant women should be screened for bacteria in the urinary tract. Identifying and treating a UTI will decrease the risk of preterm labor and developing more serious infections in both the mother and baby. CAUSES Bacteria germs cause almost all UTIs.  RISK FACTORS Many factors can increase your chances of getting a UTI during pregnancy. These include:  Having a short urethra.  Poor toilet and hygiene habits.  Sexual intercourse.  Blockage of urine along the urinary tract.  Problems with the pelvic muscles or nerves.  Diabetes.  Obesity.  Bladder problems after having several children.  Previous history of UTI. SIGNS AND SYMPTOMS   Pain, burning, or a stinging feeling when urinating.  Suddenly feeling the need to urinate right away (urgency).  Loss of bladder control (urinary incontinence).  Frequent urination, more than is common with pregnancy.  Lower abdominal or back discomfort.  Cloudy urine.  Blood in the urine (hematuria).  Fever. When the kidneys are infected, the symptoms may be:  Back pain.  Flank pain on the right side more so than the left.  Fever.  Chills.  Nausea.  Vomiting. DIAGNOSIS  A urinary tract infection is usually diagnosed through urine tests. Additional tests and procedures are sometimes done. These may include:  Ultrasound exam of the kidneys, ureters, bladder, and urethra.  Looking in the bladder with a lighted tube (cystoscopy). TREATMENT Typically, UTIs can be treated with antibiotic medicines.  HOME CARE INSTRUCTIONS   Only take over-the-counter or prescription medicines as directed by your health care provider. If you were prescribed antibiotics, take them as directed. Finish them even if you start to feel better.  Drink enough fluids to keep your urine clear or pale  yellow.  Do not have sexual intercourse until the infection is gone and your health care provider says it is okay.  Make sure you are tested for UTIs throughout your pregnancy. These infections often come back. Preventing a UTI in the Future  Practice good toilet habits. Always wipe from front to back. Use the tissue only once.  Do not hold your urine. Empty your bladder as soon as possible when the urge comes.  Do not douche or use deodorant sprays.  Wash with soap and warm water around the genital area and the anus.  Empty your bladder before and after sexual intercourse.  Wear underwear with a cotton crotch.  Avoid caffeine and carbonated drinks. They can irritate the bladder.  Drink cranberry juice or take cranberry pills. This may decrease the risk of getting a UTI.  Do not drink alcohol.  Keep all your appointments and tests as scheduled. SEEK MEDICAL CARE IF:   Your symptoms get worse.  You are still having fevers 2 or more days after treatment begins.  You have a rash.  You feel that you are having problems with medicines prescribed.  You have abnormal vaginal discharge. SEEK IMMEDIATE MEDICAL CARE IF:   You have back or flank pain.  You have chills.  You have blood in your urine.  You have nausea and vomiting.  You have contractions of your uterus.  You have a gush of fluid from the vagina. MAKE SURE YOU:  Understand these instructions.   Will watch your condition.   Will get help right away if you are not doing well or get worse.    This information is not intended to replace advice given to you by your health care provider. Make sure you discuss any questions you have with your health care provider.   Document Released: 05/31/2010 Document Revised: 11/24/2012 Document Reviewed: 09/02/2012 Elsevier Interactive Patient Education [employer]

## 2015-10-30 NOTE — Progress Notes (Signed)
Lisa Leftwich Kirby CNM in earlier to discuss d/c plan. Written and verbal d/c instructions given and understanding voiced. 

## 2015-10-30 NOTE — MAU Note (Signed)
Urine in lab 

## 2015-11-09 ENCOUNTER — Other Ambulatory Visit: Payer: Self-pay | Admitting: Obstetrics & Gynecology

## 2016-01-03 ENCOUNTER — Encounter (HOSPITAL_COMMUNITY): Payer: Self-pay | Admitting: Student

## 2016-01-03 ENCOUNTER — Inpatient Hospital Stay (HOSPITAL_COMMUNITY)
Admission: AD | Admit: 2016-01-03 | Discharge: 2016-01-03 | Disposition: A | Discharge status: Home / Self Care | Payer: 59 | Source: Ambulatory Visit | Attending: Obstetrics & Gynecology | Admitting: Obstetrics & Gynecology

## 2016-01-03 DIAGNOSIS — Z7982 Long term (current) use of aspirin: Secondary | ICD-10-CM | POA: Diagnosis not present

## 2016-01-03 DIAGNOSIS — R002 Palpitations: Secondary | ICD-10-CM | POA: Insufficient documentation

## 2016-01-03 DIAGNOSIS — Z3A25 25 weeks gestation of pregnancy: Secondary | ICD-10-CM | POA: Insufficient documentation

## 2016-01-03 DIAGNOSIS — R51 Headache: Secondary | ICD-10-CM | POA: Insufficient documentation

## 2016-01-03 DIAGNOSIS — O26892 Other specified pregnancy related conditions, second trimester: Secondary | ICD-10-CM | POA: Diagnosis not present

## 2016-01-03 DIAGNOSIS — R519 Headache, unspecified: Secondary | ICD-10-CM

## 2016-01-03 HISTORY — DX: Gestational (pregnancy-induced) hypertension without significant proteinuria, unspecified trimester: O13.9

## 2016-01-03 LAB — URINE MICROSCOPIC-ADD ON

## 2016-01-03 LAB — URINALYSIS, ROUTINE W REFLEX MICROSCOPIC
BILIRUBIN URINE: NEGATIVE
Glucose, UA: NEGATIVE mg/dL
Ketones, ur: NEGATIVE mg/dL
Nitrite: NEGATIVE
Protein, ur: NEGATIVE mg/dL
Specific Gravity, Urine: <1.005 — ABNORMAL LOW (ref 1.005–1.030)
pH: 7 (ref 5.0–8.0)

## 2016-01-03 LAB — PROTEIN / CREATININE RATIO, URINE
Creatinine, Urine: 35 mg/dL
Total Protein, Urine: <6 mg/dL

## 2016-01-03 LAB — COMPREHENSIVE METABOLIC PANEL
ALK PHOS: 102 U/L (ref 38–126)
ALT: 12 U/L — AB (ref 14–54)
AST: 15 U/L (ref 15–41)
Albumin: 2.9 g/dL — ABNORMAL LOW (ref 3.5–5.0)
Anion gap: 7 (ref 5–15)
BUN: 6 mg/dL (ref 6–20)
CHLORIDE: 105 mmol/L (ref 101–111)
CO2: 22 mmol/L (ref 22–32)
Calcium: 8.9 mg/dL (ref 8.9–10.3)
Creatinine, Ser: 0.45 mg/dL (ref 0.44–1.00)
GFR calc Af Amer: >60 mL/min (ref >60)
GFR calc non Af Amer: >60 mL/min (ref >60)
Glucose, Bld: 76 mg/dL (ref 65–99)
Potassium: 4.1 mmol/L (ref 3.5–5.1)
SODIUM: 134 mmol/L — AB (ref 135–145)
Total Bilirubin: 0.5 mg/dL (ref 0.3–1.2)
Total Protein: 6.9 g/dL (ref 6.5–8.1)

## 2016-01-03 LAB — CBC
HCT: 34.5 % — ABNORMAL LOW (ref 36.0–46.0)
HEMOGLOBIN: 12 g/dL (ref 12.0–15.0)
MCH: 32.6 pg (ref 26.0–34.0)
MCHC: 34.8 g/dL (ref 30.0–36.0)
MCV: 93.8 fL (ref 78.0–100.0)
PLATELETS: 286 10*3/uL (ref 150–400)
RBC: 3.68 MIL/uL — AB (ref 3.87–5.11)
RDW: 13.2 % (ref 11.5–15.5)
WBC: 9 10*3/uL (ref 4.0–10.5)

## 2016-01-03 LAB — URIC ACID: URIC ACID, SERUM: 4.3 mg/dL (ref 2.3–6.6)

## 2016-01-03 LAB — LACTATE DEHYDROGENASE: LDH: 133 U/L (ref 98–192)

## 2016-01-03 MED ORDER — ACETAMINOPHEN 325 MG PO TABS
650.0000 mg | ORAL_TABLET | Freq: Once | ORAL | Status: AC
  Administered 2016-01-03: 650 mg via ORAL
  Filled 2016-01-03: qty 2

## 2016-01-03 NOTE — MAU Note (Signed)
Pt stated she stated noticing her HR was elevated and her b/p (134/81 HR 115). Has history of an arythmia  In the past. Not on meds anymore. Notified MD and was told to come to MAU. Reports mild H/A and some ctx as well as well.

## 2016-01-03 NOTE — Progress Notes (Signed)
lying

## 2016-01-03 NOTE — MAU Provider Note (Signed)
History     CSN: [id number]  Arrival date and time: 01/03/16 [id number]   First Provider Initiated Contact with Patient 01/03/16 1009      Chief Complaint  Patient presents with  . Palpitations  . Headache   HPI Kristin Sellers is a 30 y.o. G3P2002 at [id number] who presents with palpitations, SOB, & headache. Symptoms began 3 days ago. History of palpitations & tachycardia that was treated with beta blockers several years ago; currently no meds. Last saw cardiologist 5 years ago; had normal echo at that time. Currently complains of intermittent palpitations & tachycardia for the last 3 days. Symptoms worse with walking & activity. Endorses SOB on exertion. Headache today. Frontal constant headache since this morning. Rates pain 3/10. Has not treated. Denies vision changes, epigastric pain, chest pain, abdominal pain, vaginal bleeding, SOB, or dizziness. Positive fetal movement.   OB History    Gravida Para Term Preterm AB Living   3 2 2  0 0 2   SAB TAB Ectopic Multiple Live Births   0 0 0 0 2      Past Medical History:  Diagnosis Date  . Allergy   . Asthma   . Heart murmur   . History of Bell's palsy   . Hyperlipidemia   . Palpitations   . Pregnancy induced hypertension 2014   GHTN    Past Surgical History:  Procedure Laterality Date  . MOUTH SURGERY    . TONSILLECTOMY AND ADENOIDECTOMY      Family History  Problem Relation Age of Onset  . Heart disease Mother   . Hyperlipidemia Mother   . Heart disease Father   . Hyperlipidemia Father   . Hypertension Father   . Coronary artery disease Father   . Heart attack Father   . Early death Paternal Grandfather   . Coronary artery disease    . Hypertension    . Heart attack    . Anesthesia problems Neg Hx   . Hypotension Neg Hx   . Malignant hyperthermia Neg Hx   . Pseudochol deficiency Neg Hx     Social History  Substance Use Topics  . Smoking status: Never Smoker  . Smokeless tobacco: Never Used  . Alcohol use No      Comment: occasional glass of wine when not pregnant    Allergies:  Allergies  Allergen Reactions  . Sulfa Antibiotics Hives    Prescriptions Prior to Admission  Medication Sig Dispense Refill Last Dose  . acetaminophen (TYLENOL) 325 MG tablet Take 650 mg by mouth every 6 (six) hours as needed for mild pain, fever or headache.    01/02/2016 at Unknown time  . aspirin EC 81 MG tablet Take 81 mg by mouth at bedtime.   01/02/2016 at Unknown time  . Prenatal Vit-Fe Fumarate-FA (PRENATAL MULTIVITAMIN) TABS tablet Take 1 tablet by mouth at bedtime.   01/02/2016 at Unknown time  . metoCLOPramide (REGLAN) 10 MG tablet Take 1 tablet (10 mg total) by mouth every 6 (six) hours as needed for nausea. (Patient not taking: Reported on 01/03/2016) 30 tablet 0 Completed Course at Unknown time  . ondansetron (ZOFRAN) 4 MG tablet Take 1 tablet (4 mg total) by mouth every 8 (eight) hours as needed for nausea or vomiting. (Patient not taking: Reported on 01/03/2016) 20 tablet 0 Completed Course at Unknown time  . PROAIR HFA 108 (90 Base) MCG/ACT inhaler INHALE 2 PUFF(S) EVERY 4 HOURS BY INHALATION ROUTE AS NEEDED FOR SHORTNESS OF BREATH  5  PRN    Review of Systems  Constitutional: Negative.   Respiratory: Positive for shortness of breath.   Cardiovascular: Positive for palpitations. Negative for chest pain.  Gastrointestinal: Negative.   Genitourinary: Negative.   Neurological: Positive for headaches. Negative for dizziness and loss of consciousness.   Physical Exam   Blood pressure 126/86, pulse 98, temperature 98.6 F (37 C), temperature source Oral, resp. rate 18, height 5\' 2"  (1.575 m), weight 180 lb 12.8 oz (82 kg), last menstrual period 11/16/2014, SpO2 99 %.  Patient Vitals for the past 24 hrs:  BP Temp Temp src Pulse Resp SpO2 Height Weight  01/03/16 1030 130/84 - - 106 - - - -  01/03/16 1029 120/81 - - 96 - - - -  01/03/16 1027 121/79 - - 95 - - - -  01/03/16 1020 123/85 - - 99 - - - -   01/03/16 1010 122/87 - - 104 - - - -  01/03/16 1000 129/84 - - 98 - - - -  01/03/16 0956 126/86 - - 98 18 - - -  01/03/16 0933 - - - - - 99 % - -  01/03/16 0931 127/82 98.6 F (37 C) Oral 112 18 - 5\' 2"  (1.575 m) 180 lb 12.8 oz (82 kg)    Physical Exam  Nursing note and vitals reviewed. Constitutional: She is oriented to person, place, and time. She appears well-developed and well-nourished. No distress.  HENT:  Head: Normocephalic and atraumatic.  Eyes: Conjunctivae are normal. Right eye exhibits no discharge. Left eye exhibits no discharge. No scleral icterus.  Neck: Normal range of motion.  Cardiovascular: Normal rate, regular rhythm and normal heart sounds.   No murmur heard. Respiratory: Effort normal and breath sounds normal. No respiratory distress. She has no wheezes.  Neurological: She is alert and oriented to person, place, and time.  Skin: Skin is warm and dry. She is not diaphoretic.  Psychiatric: She has a normal mood and affect. Her behavior is normal. Judgment and thought content normal.   Fetal Tracing:  Baseline: 145 Variability: moderate Accelerations: 10x10 Decelerations: small variable  Toco: none  MAU Course  Procedures Results for orders placed or performed during the hospital encounter of 01/03/16 (from the past 24 hour(s))  Protein / creatinine ratio, urine     Status: None   Collection Time: 01/03/16  9:35 AM  Result Value Ref Range   Creatinine, Urine 35.00 mg/dL   Total Protein, Urine <6 mg/dL   Protein Creatinine Ratio        0.00 - 0.15 mg/mg[Cre]  Urinalysis, Routine w reflex microscopic (not [facility])     Status: Abnormal   Collection Time: 01/03/16  9:38 AM  Result Value Ref Range   Color, Urine YELLOW YELLOW   APPearance HAZY (A) CLEAR   Specific Gravity, Urine <1.005 (L) 1.005 - 1.030   pH 7.0 5.0 - 8.0   Glucose, UA NEGATIVE NEGATIVE mg/dL   Hgb urine dipstick TRACE (A) NEGATIVE   Bilirubin Urine NEGATIVE NEGATIVE   Ketones, ur  NEGATIVE NEGATIVE mg/dL   Protein, ur NEGATIVE NEGATIVE mg/dL   Nitrite NEGATIVE NEGATIVE   Leukocytes, UA LARGE (A) NEGATIVE  Urine microscopic-add on     Status: Abnormal   Collection Time: 01/03/16  9:38 AM  Result Value Ref Range   Squamous Epithelial / LPF 6-30 (A) NONE SEEN   WBC, UA 6-30 0 - 5 WBC/hpf   RBC / HPF 0-5 0 - 5 RBC/hpf   Bacteria,  UA MANY (A) NONE SEEN  CBC     Status: Abnormal   Collection Time: 01/03/16 10:25 AM  Result Value Ref Range   WBC 9.0 4.0 - 10.5 K/uL   RBC 3.68 (L) 3.87 - 5.11 MIL/uL   Hemoglobin 12.0 12.0 - 15.0 g/dL   HCT 16.1 (L) 09.6 - 04.5 %   MCV 93.8 78.0 - 100.0 fL   MCH 32.6 26.0 - 34.0 pg   MCHC 34.8 30.0 - 36.0 g/dL   RDW 40.9 81.1 - 91.4 %   Platelets 286 150 - 400 K/uL  Comprehensive metabolic panel     Status: Abnormal   Collection Time: 01/03/16 10:25 AM  Result Value Ref Range   Sodium 134 (L) 135 - 145 mmol/L   Potassium 4.1 3.5 - 5.1 mmol/L   Chloride 105 101 - 111 mmol/L   CO2 22 22 - 32 mmol/L   Glucose, Bld 76 65 - 99 mg/dL   BUN 6 6 - 20 mg/dL   Creatinine, Ser 7.82 0.44 - 1.00 mg/dL   Calcium 8.9 8.9 - 95.6 mg/dL   Total Protein 6.9 6.5 - 8.1 g/dL   Albumin 2.9 (L) 3.5 - 5.0 g/dL   AST 15 15 - 41 U/L   ALT 12 (L) 14 - 54 U/L   Alkaline Phosphatase 102 38 - 126 U/L   Total Bilirubin 0.5 0.3 - 1.2 mg/dL   GFR calc non Af Amer >60 >60 mL/min   GFR calc Af Amer >60 >60 mL/min   Anion gap 7 5 - 15  Lactate dehydrogenase     Status: None   Collection Time: 01/03/16 10:25 AM  Result Value Ref Range   LDH 133 98 - 192 U/L  Uric acid     Status: None   Collection Time: 01/03/16 10:25 AM  Result Value Ref Range   Uric Acid, Serum 4.3 2.3 - 6.6 mg/dL    MDM Category 1 tracing VSS, NAD Tylenol 650 mg PO -- headache resolved EKG, normal sinus rhythm PIH evaluation done d/t headache & hx of PIH.  S/w Dr. Mora Appl regarding presentation, VS, labs & EKG. Ok to discharge home.   Assessment and Plan  A:  1. Pregnancy  headache in second trimester   2. Palpitations    P; Discharge home Take tylenol prn headache Increase water intake & eat every 2-3 hrs Keep f/u in office next week Discussed reasons to return to MAU  Judeth Horn 01/03/2016, 10:09 AM

## 2016-01-03 NOTE — Progress Notes (Signed)
sitting

## 2016-01-03 NOTE — Discharge Instructions (Signed)
Palpitations Introduction A palpitation is the feeling that your heart:  Has an uneven (irregular) heartbeat.  Is beating faster than normal.  Is fluttering.  Is skipping a beat. This is usually not a serious problem. In some cases, you may need more medical tests. Follow these instructions at home:  Avoid:  Caffeine in coffee, tea, soft drinks, diet pills, and energy drinks.  Chocolate.  Alcohol.  Do not use any tobacco products. These include cigarettes, chewing tobacco, and e-cigarettes. If you need help quitting, ask your doctor.  Try to reduce your stress. These things may help:  Yoga.  Meditation.  Physical activity. Swimming, jogging, and walking are good choices.  A method that helps you use your mind to control things in your body, like heartbeats (biofeedback).  Get plenty of rest and sleep.  Take over-the-counter and prescription medicines only as told by your doctor.  Keep all follow-up visits as told by your doctor. This is important. Contact a doctor if:  Your heartbeat is still fast or uneven after 24 hours.  Your palpitations occur more often. Get help right away if:  You have chest pain.  You feel short of breath.  You have a very bad headache.  You feel dizzy.  You pass out (faint). This information is not intended to replace advice given to you by your health care provider. Make sure you discuss any questions you have with your health care provider. Document Released: 11/13/2007 Document Revised: 07/12/2015 Document Reviewed: 10/19/2014  2017 Elsevier Hypertension During Pregnancy Hypertension, commonly called high blood pressure, is when the force of blood pumping through your arteries is too strong. Arteries are blood vessels that carry blood from the heart throughout the body. Hypertension during pregnancy can cause problems for you and your baby. Your baby may be born early (prematurely) or may not weigh as much as he or she should at  birth. Very bad cases of hypertension during pregnancy can be life-threatening. Different types of hypertension can occur during pregnancy. These include:  Chronic hypertension. This happens when:  You have hypertension before pregnancy and it continues during pregnancy.  You develop hypertension before you are [redacted] weeks pregnant, and it continues during pregnancy.  Gestational hypertension. This is hypertension that develops after the 20th week of pregnancy.  Preeclampsia, also called toxemia of pregnancy. This is a very serious type of hypertension that develops only during pregnancy. It affects the whole body, and it can be very dangerous for you and your baby. Gestational hypertension and preeclampsia usually go away within 6 weeks after your baby is born. Women who have hypertension during pregnancy have a greater chance of developing hypertension later in life or during future pregnancies. What are the causes? The exact cause of hypertension is not known. What increases the risk? There are certain factors that make it more likely for you to develop hypertension during pregnancy. These include:  Having hypertension during a previous pregnancy or prior to pregnancy.  Being overweight.  Being older than age 31.  Being pregnant for the first time or being pregnant with more than one baby.  Becoming pregnant using fertilization methods such as IVF (in vitro fertilization).  Having diabetes, kidney problems, or systemic lupus erythematosus.  Having a family history of hypertension. What are the signs or symptoms? Chronic hypertension and gestational hypertension rarely cause symptoms. Preeclampsia causes symptoms, which may include:  Increased protein in your urine. Your health care provider will check for this at every visit before you give  birth (prenatal visit).  Severe headaches.  Sudden weight gain.  Swelling of the hands, face, legs, and feet.  Nausea and  vomiting.  Vision problems, such as blurred or double vision.  Numbness in the face, arms, legs, and feet.  Dizziness.  Slurred speech.  Sensitivity to bright lights.  Abdominal pain.  Convulsions. How is this diagnosed? You may be diagnosed with hypertension during a routine prenatal exam. At each prenatal visit, you may:  Have a urine test to check for high amounts of protein in your urine.  Have your blood pressure checked. A blood pressure reading is recorded as two numbers, such as "120 over 80" (or 120/80). The first ("top") number is called the systolic pressure. It is a measure of the pressure in your arteries when your heart beats. The second ("bottom") number is called the diastolic pressure. It is a measure of the pressure in your arteries as your heart relaxes between beats. Blood pressure is measured in a unit called mm Hg. A normal blood pressure reading is:  Systolic: below 120.  Diastolic: below 80. The type of hypertension that you are diagnosed with depends on your test results and when your symptoms developed.  Chronic hypertension is usually diagnosed before 20 weeks of pregnancy.  Gestational hypertension is usually diagnosed after 20 weeks of pregnancy.  Hypertension with high amounts of protein in the urine is diagnosed as preeclampsia.  Blood pressure measurements that stay above 160 systolic, or above 110 diastolic, are signs of severe preeclampsia. How is this treated? Treatment for hypertension during pregnancy varies depending on the type of hypertension you have and how serious it is.  If you take medicines called ACE inhibitors to treat chronic hypertension, you may need to switch medicines. ACE inhibitors should not be taken during pregnancy.  If you have gestational hypertension, you may need to take blood pressure medicine.  If you are at risk for preeclampsia, your health care provider may recommend that you take a low-dose aspirin every day  to prevent high blood pressure during your pregnancy.  If you have severe preeclampsia, you may need to be hospitalized so you and your baby can be monitored closely. You may also need to take medicine (magnesium sulfate) to prevent seizures and to lower blood pressure. This medicine may be given as an injection or through an IV tube.  In some cases, if your condition gets worse, you may need to deliver your baby early. Follow these instructions at home: Eating and drinking  Drink enough fluid to keep your urine clear or pale yellow.  Eat a healthy diet that is low in salt (sodium). Do not add salt to your food. Check food labels to see how much sodium a food or beverage contains. Lifestyle  Do not use any products that contain nicotine or tobacco, such as cigarettes and e-cigarettes. If you need help quitting, ask your health care provider.  Do not use alcohol.  Avoid caffeine.  Avoid stress as much as possible. Rest and get plenty of sleep. General instructions  Take over-the-counter and prescription medicines only as told by your health care provider.  While lying down, lie on your left side. This keeps pressure off your baby.  While sitting or lying down, raise (elevate) your feet. Try putting some pillows under your lower legs.  Exercise regularly. Ask your health care provider what kinds of exercise are best for you.  Keep all prenatal and follow-up visits as told by your health care provider.  This is important. Contact a health care provider if:  You have symptoms that your health care provider told you may require more treatment or monitoring, such as:  Fever.  Vomiting.  Headache. Get help right away if:  You have severe abdominal pain or vomiting that does not get better with treatment.  You suddenly develop swelling in your hands, ankles, or face.  You gain 4 lbs (1.8 kg) or more in 1 week.  You develop vaginal bleeding, or you have blood in your  urine.  You do not feel your baby moving as much as usual.  You have blurred or double vision.  You have muscle twitching or sudden tightening (spasms).  You have shortness of breath.  Your lips or fingernails turn blue. This information is not intended to replace advice given to you by your health care provider. Make sure you discuss any questions you have with your health care provider. Document Released: 10/22/2010 Document Revised: 08/24/2015 Document Reviewed: 07/20/2015 Elsevier Interactive Patient Education  2017 [employer]

## 2016-01-09 DIAGNOSIS — Z369 Encounter for antenatal screening, unspecified: Secondary | ICD-10-CM | POA: Diagnosis not present

## 2016-01-09 DIAGNOSIS — Z3480 Encounter for supervision of other normal pregnancy, unspecified trimester: Secondary | ICD-10-CM | POA: Diagnosis not present

## 2016-01-09 DIAGNOSIS — Z23 Encounter for immunization: Secondary | ICD-10-CM | POA: Diagnosis not present

## 2016-01-14 DIAGNOSIS — R030 Elevated blood-pressure reading, without diagnosis of hypertension: Secondary | ICD-10-CM | POA: Diagnosis not present

## 2016-01-22 DIAGNOSIS — O99810 Abnormal glucose complicating pregnancy: Secondary | ICD-10-CM | POA: Diagnosis not present

## 2016-02-18 NOTE — L&D Delivery Note (Signed)
Delivery Note At 2:57 PM a viable and healthy female was delivered via Vaginal, Spontaneous Delivery (Presentation:Left occiput ; Anterior  ).  APGAR:8 , 9; weight  pending.   Placenta status: spontaneous, intact.  Cord: 3V  Anesthesia:   Episiotomy: None Lacerations: 1st degree;Perineal Suture Repair: 3.0 vicryl Est. Blood Loss (mL):  300 cc  Mom to postpartum.  Baby to Couplet care / Skin to Skin.  Kristin Sellers,Kristin H. 03/27/2016, 3:19 PM

## 2016-02-19 ENCOUNTER — Encounter (HOSPITAL_COMMUNITY): Payer: Self-pay

## 2016-02-19 ENCOUNTER — Inpatient Hospital Stay (HOSPITAL_COMMUNITY)
Admission: AD | Admit: 2016-02-19 | Discharge: 2016-02-19 | Disposition: A | Discharge status: Home / Self Care | Payer: 59 | Source: Ambulatory Visit | Attending: Obstetrics and Gynecology | Admitting: Obstetrics and Gynecology

## 2016-02-19 DIAGNOSIS — Z3A31 31 weeks gestation of pregnancy: Secondary | ICD-10-CM | POA: Insufficient documentation

## 2016-02-19 DIAGNOSIS — R519 Headache, unspecified: Secondary | ICD-10-CM

## 2016-02-19 DIAGNOSIS — O26893 Other specified pregnancy related conditions, third trimester: Secondary | ICD-10-CM | POA: Insufficient documentation

## 2016-02-19 DIAGNOSIS — R030 Elevated blood-pressure reading, without diagnosis of hypertension: Secondary | ICD-10-CM | POA: Insufficient documentation

## 2016-02-19 DIAGNOSIS — R51 Headache: Secondary | ICD-10-CM | POA: Diagnosis not present

## 2016-02-19 DIAGNOSIS — O139 Gestational [pregnancy-induced] hypertension without significant proteinuria, unspecified trimester: Secondary | ICD-10-CM | POA: Diagnosis not present

## 2016-02-19 DIAGNOSIS — Z7982 Long term (current) use of aspirin: Secondary | ICD-10-CM | POA: Diagnosis not present

## 2016-02-19 LAB — COMPREHENSIVE METABOLIC PANEL
ALK PHOS: 106 U/L (ref 38–126)
ALT: 12 U/L — ABNORMAL LOW (ref 14–54)
ANION GAP: 7 (ref 5–15)
AST: 16 U/L (ref 15–41)
Albumin: 2.8 g/dL — ABNORMAL LOW (ref 3.5–5.0)
BUN: 7 mg/dL (ref 6–20)
CALCIUM: 8.1 mg/dL — AB (ref 8.9–10.3)
CO2: 21 mmol/L — AB (ref 22–32)
Chloride: 105 mmol/L (ref 101–111)
Creatinine, Ser: 0.45 mg/dL (ref 0.44–1.00)
Glucose, Bld: 113 mg/dL — ABNORMAL HIGH (ref 65–99)
Potassium: 3.4 mmol/L — ABNORMAL LOW (ref 3.5–5.1)
SODIUM: 133 mmol/L — AB (ref 135–145)
Total Bilirubin: 0.1 mg/dL — ABNORMAL LOW (ref 0.3–1.2)
Total Protein: 6.9 g/dL (ref 6.5–8.1)

## 2016-02-19 LAB — CBC
HCT: 30.6 % — ABNORMAL LOW (ref 36.0–46.0)
Hemoglobin: 10.7 g/dL — ABNORMAL LOW (ref 12.0–15.0)
MCH: 32.3 pg (ref 26.0–34.0)
MCHC: 35 g/dL (ref 30.0–36.0)
MCV: 92.4 fL (ref 78.0–100.0)
PLATELETS: 267 10*3/uL (ref 150–400)
RBC: 3.31 MIL/uL — ABNORMAL LOW (ref 3.87–5.11)
RDW: 12.8 % (ref 11.5–15.5)
WBC: 7 10*3/uL (ref 4.0–10.5)

## 2016-02-19 LAB — PROTEIN / CREATININE RATIO, URINE
CREATININE, URINE: 69 mg/dL
Protein Creatinine Ratio: 0.22 mg/mg{Cre} — ABNORMAL HIGH (ref 0.00–0.15)
Total Protein, Urine: 15 mg/dL

## 2016-02-19 LAB — URINALYSIS, ROUTINE W REFLEX MICROSCOPIC
Bilirubin Urine: NEGATIVE
Glucose, UA: 50 mg/dL — AB
HGB URINE DIPSTICK: NEGATIVE
Ketones, ur: NEGATIVE mg/dL
Nitrite: NEGATIVE
PROTEIN: NEGATIVE mg/dL
SPECIFIC GRAVITY, URINE: 1.013 (ref 1.005–1.030)
pH: 7 (ref 5.0–8.0)

## 2016-02-19 LAB — URIC ACID: URIC ACID, SERUM: 3.9 mg/dL (ref 2.3–6.6)

## 2016-02-19 LAB — LACTATE DEHYDROGENASE: LDH: 135 U/L (ref 98–192)

## 2016-02-19 MED ORDER — LACTATED RINGERS IV BOLUS (SEPSIS)
1000.0000 mL | Freq: Once | INTRAVENOUS | Status: AC
  Administered 2016-02-19: 1000 mL via INTRAVENOUS

## 2016-02-19 MED ORDER — METOCLOPRAMIDE HCL 5 MG/ML IJ SOLN
10.0000 mg | Freq: Once | INTRAMUSCULAR | Status: AC
  Administered 2016-02-19: 10 mg via INTRAVENOUS
  Filled 2016-02-19: qty 2

## 2016-02-19 NOTE — Discharge Instructions (Signed)
Hypertension During Pregnancy Hypertension is also called high blood pressure. High blood pressure means that the force of your blood moving in your body is too strong. When you are pregnant, this condition should be watched carefully. It can cause problems for you and your baby. Follow these instructions at home: Eating and drinking  Drink enough fluid to keep your pee (urine) clear or pale yellow.  Eat healthy foods that are low in salt (sodium). ? Do not add salt to your food. ? Check labels on foods and drinks to see much salt is in them. Look on the label where you see "Sodium." Lifestyle  Do not use any products that contain nicotine or tobacco, such as cigarettes and e-cigarettes. If you need help quitting, ask your doctor.  Do not use alcohol.  Avoid caffeine.  Avoid stress. Rest and get plenty of sleep. General instructions  Take over-the-counter and prescription medicines only as told by your doctor.  While lying down, lie on your left side. This keeps pressure off your baby.  While sitting or lying down, raise (elevate) your feet. Try putting some pillows under your lower legs.  Exercise regularly. Ask your doctor what kinds of exercise are best for you.  Keep all prenatal and follow-up visits as told by your doctor. This is important. Contact a doctor if:  You have symptoms that your doctor told you to watch for, such as: ? Fever. ? Throwing up (vomiting). ? Headache. Get help right away if:  You have very bad pain in your belly (abdomen).  You are throwing up, and this does not get better with treatment.  You suddenly get swelling in your hands, ankles, or face.  You gain 4 lb (1.8 kg) or more in 1 week.  You get bleeding from your vagina.  You have blood in your pee.  You do not feel your baby moving as much as normal.  You have a change in vision.  You have muscle twitching or sudden tightening (spasms).  You have trouble breathing.  Your lips  or fingernails turn blue. This information is not intended to replace advice given to you by your health care provider. Make sure you discuss any questions you have with your health care provider. Document Released: 03/08/2010 Document Revised: 10/16/2015 Document Reviewed: 10/16/2015 Elsevier Interactive Patient Education  2017 Elsevier Inc.  

## 2016-02-19 NOTE — MAU Provider Note (Signed)
History     CSN: [id number]  Arrival date and time: 02/19/16 1716   First Provider Initiated Contact with Patient 02/19/16 1757      Chief Complaint  Patient presents with  . Hypertension  . Headache   HPI   [name] is a 31 y.o. female G3P2002 @ 41w6dhere in MAU with headache and changes in her vision. The HA started this afternoon. She has [place] anything for the [place] She had a prenatal appointment today and had mildly elevated BP readings. She was sent here for further evaluation.  Her BP has been elevated off and on throughout the last few weeks of this pregnancy. She had severe preeclampsia, on magnesium with her last pregnancy.   OB History    Gravida Para Term Preterm AB Living   3 2 2  0 0 2   SAB TAB Ectopic Multiple Live Births   0 0 0 0 2      Past Medical History:  Diagnosis Date  . Allergy   . Asthma   . Heart murmur   . History of Bell's palsy   . Hyperlipidemia   . Palpitations   . Pregnancy induced hypertension 2014   GHTN    Past Surgical History:  Procedure Laterality Date  . MOUTH SURGERY    . TONSILLECTOMY AND ADENOIDECTOMY      Family History  Problem Relation Age of Onset  . Heart disease Mother   . Hyperlipidemia Mother   . Heart disease Father   . Hyperlipidemia Father   . Hypertension Father   . Coronary artery disease Father   . Heart attack Father   . Early death Paternal Grandfather   . Coronary artery disease    . Hypertension    . Heart attack    . Anesthesia problems Neg Hx   . Hypotension Neg Hx   . Malignant hyperthermia Neg Hx   . Pseudochol deficiency Neg Hx     Social History  Substance Use Topics  . Smoking status: Never Smoker  . Smokeless tobacco: Never Used  . Alcohol use No     Comment: occasional glass of wine when not pregnant    Allergies:  Allergies  Allergen Reactions  . Sulfa Antibiotics Hives    Prescriptions Prior to Admission  Medication Sig Dispense Refill Last Dose  .  acetaminophen (TYLENOL) 325 MG tablet Take 650 mg by mouth every 6 (six) hours as needed for mild pain, fever or headache.    01/02/2016 at Unknown time  . aspirin EC 81 MG tablet Take 81 mg by mouth at bedtime.   01/02/2016 at Unknown time  . metoCLOPramide (REGLAN) 10 MG tablet Take 1 tablet (10 mg total) by mouth every 6 (six) hours as needed for nausea. (Patient not taking: Reported on 01/03/2016) 30 tablet 0 Completed Course at Unknown time  . ondansetron (ZOFRAN) 4 MG tablet Take 1 tablet (4 mg total) by mouth every 8 (eight) hours as needed for nausea or vomiting. (Patient not taking: Reported on 01/03/2016) 20 tablet 0 Completed Course at Unknown time  . Prenatal Vit-Fe Fumarate-FA (PRENATAL MULTIVITAMIN) TABS tablet Take 1 tablet by mouth at bedtime.   01/02/2016 at Unknown time  . PROAIR HFA 108 (90 Base) MCG/ACT inhaler INHALE 2 PUFF(S) EVERY 4 HOURS BY INHALATION ROUTE AS NEEDED FOR SHORTNESS OF BREATH  5 PRN   Results for orders placed or performed during the hospital encounter of 02/19/16 (from the past 48 hour(s))  Protein /  creatinine ratio, urine     Status: Abnormal   Collection Time: 02/19/16  5:55 PM  Result Value Ref Range   Creatinine, Urine 69.00 mg/dL   Total Protein, Urine 15 mg/dL    Comment: NO NORMAL RANGE ESTABLISHED FOR THIS TEST   Protein Creatinine Ratio 0.22 (H) 0.00 - 0.15 mg/mg[Cre]  Urinalysis, Routine w reflex microscopic     Status: Abnormal   Collection Time: 02/19/16  5:55 PM  Result Value Ref Range   Color, Urine YELLOW YELLOW   APPearance HAZY (A) CLEAR   Specific Gravity, Urine 1.013 1.005 - 1.030   pH 7.0 5.0 - 8.0   Glucose, UA 50 (A) NEGATIVE mg/dL   Hgb urine dipstick NEGATIVE NEGATIVE   Bilirubin Urine NEGATIVE NEGATIVE   Ketones, ur NEGATIVE NEGATIVE mg/dL   Protein, ur NEGATIVE NEGATIVE mg/dL   Nitrite NEGATIVE NEGATIVE   Leukocytes, UA LARGE (A) NEGATIVE   RBC / HPF 0-5 0 - 5 RBC/hpf   WBC, UA 6-30 0 - 5 WBC/hpf   Bacteria, UA RARE (A)  NONE SEEN   Squamous Epithelial / LPF 6-30 (A) NONE SEEN  CBC     Status: Abnormal   Collection Time: 02/19/16  6:08 PM  Result Value Ref Range   WBC 7.0 4.0 - 10.5 K/uL   RBC 3.31 (L) 3.87 - 5.11 MIL/uL   Hemoglobin 10.7 (L) 12.0 - 15.0 g/dL   HCT 30.6 (L) 36.0 - 46.0 %   MCV 92.4 78.0 - 100.0 fL   MCH 32.3 26.0 - 34.0 pg   MCHC 35.0 30.0 - 36.0 g/dL   RDW 12.8 11.5 - 15.5 %   Platelets 267 150 - 400 K/uL  Comprehensive metabolic panel     Status: Abnormal   Collection Time: 02/19/16  6:08 PM  Result Value Ref Range   Sodium 133 (L) 135 - 145 mmol/L   Potassium 3.4 (L) 3.5 - 5.1 mmol/L   Chloride 105 101 - 111 mmol/L   CO2 21 (L) 22 - 32 mmol/L   Glucose, Bld 113 (H) 65 - 99 mg/dL   BUN 7 6 - 20 mg/dL   Creatinine, Ser 0.45 0.44 - 1.00 mg/dL   Calcium 8.1 (L) 8.9 - 10.3 mg/dL   Total Protein 6.9 6.5 - 8.1 g/dL   Albumin 2.8 (L) 3.5 - 5.0 g/dL   AST 16 15 - 41 U/L   ALT 12 (L) 14 - 54 U/L   Alkaline Phosphatase 106 38 - 126 U/L   Total Bilirubin 0.1 (L) 0.3 - 1.2 mg/dL   GFR calc non Af Amer >60 >60 mL/min   GFR calc Af Amer >60 >60 mL/min    Comment: (NOTE) The eGFR has been calculated using the CKD EPI equation. This calculation has not been validated in all clinical situations. eGFR's persistently <60 mL/min signify possible Chronic Kidney Disease.    Anion gap 7 5 - 15  Uric acid     Status: None   Collection Time: 02/19/16  6:08 PM  Result Value Ref Range   Uric Acid, Serum 3.9 2.3 - 6.6 mg/dL  Lactate dehydrogenase     Status: None   Collection Time: 02/19/16  6:08 PM  Result Value Ref Range   LDH 135 98 - 192 U/L    Review of Systems  Eyes: Positive for blurred vision (Hard to focus. Started 4 days ago.).  Gastrointestinal: Negative for abdominal pain.  Neurological: Positive for headaches.   Physical Exam   Blood  pressure 122/80, pulse 112, temperature 98.2 F (36.8 C), temperature source Oral, resp. rate 18, weight 188 lb (85.3 kg), last menstrual  period 11/16/2014.   Patient Vitals for the past 24 hrs:  BP Temp Temp src Pulse Resp Weight  02/19/16 1917 134/77 - - 117 - -  02/19/16 1908 139/87 - - 118 - -  02/19/16 1847 (!) 148/123 - - 110 - -  02/19/16 1837 113/76 - - 103 - -  02/19/16 1802 116/86 - - 109 - -  02/19/16 1746 122/80 - - 112 - -  02/19/16 1744 119/86 - - 109 - -  02/19/16 1724 139/84 98.2 F (36.8 C) Oral 117 18 188 lb (85.3 kg)    Physical Exam  Constitutional: She is oriented to person, place, and time. She appears well-developed and well-nourished. No distress.  HENT:  Head: Normocephalic.  Eyes: Pupils are equal, round, and reactive to light.  Musculoskeletal: Normal range of motion.  Neurological: She is alert and oriented to person, place, and time. She has normal reflexes. She displays normal reflexes.  Negative clonus   Skin: Skin is warm. She is not diaphoretic.  Psychiatric: Her behavior is normal.    Fetal Tracing: Baseline: 125 bpm  Variability: Moderate  Accelerations: 15x15 Decelerations: none  Toco: none  MAU Course  Procedures  None  MDM  PIH labs PCR Patient became anxious and had an episode of vomiting. Reglan given along with 1 liter of LR. Patient feeling much better with no other vomiting. Discussed labs and BP with with Dr. Rogue Bussing.   Assessment and Plan    A:  1. Elevated BP without diagnosis of hypertension   2. Headache in pregnancy, antepartum, third trimester     P:  Discharge home in stable condition Preeclampsia precautions Follow up with OB on Thursday for a BP check Return to MAU if symptoms worsen  Ok to use tylenol for HA, dose as directed on the bottle    Lezlie Lye, NP 02/20/2016 9:39 AM

## 2016-02-19 NOTE — MAU Note (Signed)
Sent over from the dr's office.  Dx with pregnancy induced hypertension, with a hx of pre- eclampsia.  Has been having some vision issues, sent in for further eval.  +HA, +vision changes,  Denies epigastric pain, reports increased swelling in hands and feet.

## 2016-02-21 DIAGNOSIS — O139 Gestational [pregnancy-induced] hypertension without significant proteinuria, unspecified trimester: Secondary | ICD-10-CM | POA: Diagnosis not present

## 2016-02-21 DIAGNOSIS — Z369 Encounter for antenatal screening, unspecified: Secondary | ICD-10-CM | POA: Diagnosis not present

## 2016-02-27 DIAGNOSIS — H5201 Hypermetropia, right eye: Secondary | ICD-10-CM | POA: Diagnosis not present

## 2016-02-27 DIAGNOSIS — H5212 Myopia, left eye: Secondary | ICD-10-CM | POA: Diagnosis not present

## 2016-02-27 DIAGNOSIS — H52223 Regular astigmatism, bilateral: Secondary | ICD-10-CM | POA: Diagnosis not present

## 2016-02-28 DIAGNOSIS — O139 Gestational [pregnancy-induced] hypertension without significant proteinuria, unspecified trimester: Secondary | ICD-10-CM | POA: Diagnosis not present

## 2016-03-06 DIAGNOSIS — O139 Gestational [pregnancy-induced] hypertension without significant proteinuria, unspecified trimester: Secondary | ICD-10-CM | POA: Diagnosis not present

## 2016-03-10 ENCOUNTER — Inpatient Hospital Stay (HOSPITAL_COMMUNITY)
Admission: AD | Admit: 2016-03-10 | Discharge: 2016-03-10 | Disposition: A | Discharge status: Home / Self Care | Payer: 59 | Source: Ambulatory Visit | Attending: Obstetrics and Gynecology | Admitting: Obstetrics and Gynecology

## 2016-03-10 ENCOUNTER — Inpatient Hospital Stay (HOSPITAL_COMMUNITY): Payer: 59

## 2016-03-10 ENCOUNTER — Encounter (HOSPITAL_COMMUNITY): Payer: Self-pay | Admitting: *Deleted

## 2016-03-10 DIAGNOSIS — Z3A34 34 weeks gestation of pregnancy: Secondary | ICD-10-CM | POA: Diagnosis not present

## 2016-03-10 DIAGNOSIS — O139 Gestational [pregnancy-induced] hypertension without significant proteinuria, unspecified trimester: Secondary | ICD-10-CM | POA: Diagnosis not present

## 2016-03-10 DIAGNOSIS — O26893 Other specified pregnancy related conditions, third trimester: Secondary | ICD-10-CM | POA: Insufficient documentation

## 2016-03-10 DIAGNOSIS — O133 Gestational [pregnancy-induced] hypertension without significant proteinuria, third trimester: Secondary | ICD-10-CM | POA: Diagnosis not present

## 2016-03-10 DIAGNOSIS — R05 Cough: Secondary | ICD-10-CM | POA: Diagnosis not present

## 2016-03-10 DIAGNOSIS — R51 Headache: Secondary | ICD-10-CM | POA: Insufficient documentation

## 2016-03-10 DIAGNOSIS — O163 Unspecified maternal hypertension, third trimester: Secondary | ICD-10-CM | POA: Diagnosis present

## 2016-03-10 DIAGNOSIS — R519 Headache, unspecified: Secondary | ICD-10-CM

## 2016-03-10 DIAGNOSIS — R0600 Dyspnea, unspecified: Secondary | ICD-10-CM | POA: Diagnosis not present

## 2016-03-10 DIAGNOSIS — R0602 Shortness of breath: Secondary | ICD-10-CM

## 2016-03-10 LAB — COMPREHENSIVE METABOLIC PANEL
ALK PHOS: 139 U/L — AB (ref 38–126)
ALT: 12 U/L — AB (ref 14–54)
AST: 17 U/L (ref 15–41)
Albumin: 2.8 g/dL — ABNORMAL LOW (ref 3.5–5.0)
Anion gap: 8 (ref 5–15)
BUN: 7 mg/dL (ref 6–20)
CALCIUM: 8.9 mg/dL (ref 8.9–10.3)
CO2: 21 mmol/L — AB (ref 22–32)
CREATININE: 0.41 mg/dL — AB (ref 0.44–1.00)
Chloride: 105 mmol/L (ref 101–111)
Glucose, Bld: 70 mg/dL (ref 65–99)
Potassium: 3.9 mmol/L (ref 3.5–5.1)
Sodium: 134 mmol/L — ABNORMAL LOW (ref 135–145)
Total Bilirubin: 0.2 mg/dL — ABNORMAL LOW (ref 0.3–1.2)
Total Protein: 6.9 g/dL (ref 6.5–8.1)

## 2016-03-10 LAB — PROTEIN / CREATININE RATIO, URINE
CREATININE, URINE: 60 mg/dL
Protein Creatinine Ratio: 0.18 mg/mg{Cre} — ABNORMAL HIGH (ref 0.00–0.15)
Total Protein, Urine: 11 mg/dL

## 2016-03-10 LAB — URINALYSIS, ROUTINE W REFLEX MICROSCOPIC
Bilirubin Urine: NEGATIVE
GLUCOSE, UA: 50 mg/dL — AB
HGB URINE DIPSTICK: NEGATIVE
Ketones, ur: NEGATIVE mg/dL
NITRITE: NEGATIVE
PH: 6 (ref 5.0–8.0)
Protein, ur: NEGATIVE mg/dL
SPECIFIC GRAVITY, URINE: 1.011 (ref 1.005–1.030)

## 2016-03-10 LAB — CBC
HEMATOCRIT: 34.6 % — AB (ref 36.0–46.0)
HEMOGLOBIN: 12.2 g/dL (ref 12.0–15.0)
MCH: 32.2 pg (ref 26.0–34.0)
MCHC: 35.3 g/dL (ref 30.0–36.0)
MCV: 91.3 fL (ref 78.0–100.0)
PLATELETS: 272 10*3/uL (ref 150–400)
RBC: 3.79 MIL/uL — ABNORMAL LOW (ref 3.87–5.11)
RDW: 12.8 % (ref 11.5–15.5)
WBC: 10.8 10*3/uL — AB (ref 4.0–10.5)

## 2016-03-10 MED ORDER — IOPAMIDOL (ISOVUE-370) INJECTION 76%
100.0000 mL | Freq: Once | INTRAVENOUS | Status: AC | PRN
  Administered 2016-03-10: 100 mL via INTRAVENOUS

## 2016-03-10 MED ORDER — BUTALBITAL-APAP-CAFFEINE 50-325-40 MG PO TABS
2.0000 | ORAL_TABLET | Freq: Once | ORAL | Status: AC
  Administered 2016-03-10: 2 via ORAL
  Filled 2016-03-10: qty 2

## 2016-03-10 NOTE — MAU Note (Addendum)
Pt sent from Dr. Tenny Crawoss office for elevated BP and HR of 138.  Pt overall doesn't feel well.  Pt has had a headache today and took Tylenol about 12:00 without relief.  Pt states she has been seeing black floaters.  No epigastric pain.  States she's becoming SOB with minimal activity since last Thurs.  Pt states she has had a nagging dry cough for a couple of weeks.  Good fetal movement.  Denies vaginal bleeding or ROM.

## 2016-03-10 NOTE — MAU Provider Note (Signed)
History     CSN: [id number]  Arrival date and time: 03/10/16 1540   First Provider Initiated Contact with Patient 03/10/16 1640      Chief Complaint  Patient presents with  . Hypertension   HPI  [name] is a 31 y.o. female G3P2002 @ 60w5dhere in MAU with multiple complaints including a cough that she has had for a few weeks, shortness of breath and increased HR X 3 days.  She does have a history of a sinus arrhythmia, however has not been treated in a few years. She was seeing Dr. [facility]Verl [facility].  She had asthma only as a child   The shortness of breath occurs randomly.  She has a hard time doing any type of activity due to shortness of breath. This has been going on for 3 days.   She had bronchitis early on in pregnancy and was given a proair inhaler. She has not used this recently.    OB History    Gravida Para Term Preterm AB Living   3 2 2  0 0 2   SAB TAB Ectopic Multiple Live Births   0 0 0 0 2      Past Medical History:  Diagnosis Date  . Allergy   . Asthma   . Heart murmur   . History of Bell's palsy   . Hyperlipidemia   . Palpitations   . Pregnancy induced hypertension 2014   GHTN    Past Surgical History:  Procedure Laterality Date  . APPENDECTOMY    . MOUTH SURGERY      Family History  Problem Relation Age of Onset  . Heart disease Mother   . Hyperlipidemia Mother   . Heart disease Father   . Hyperlipidemia Father   . Hypertension Father   . Coronary artery disease Father   . Heart attack Father   . Early death Paternal Grandfather   . Coronary artery disease    . Hypertension    . Heart attack    . Anesthesia problems Neg Hx   . Hypotension Neg Hx   . Malignant hyperthermia Neg Hx   . Pseudochol deficiency Neg Hx     Social History  Substance Use Topics  . Smoking status: Never Smoker  . Smokeless tobacco: Never Used  . Alcohol use No     Comment: occasional glass of wine when not pregnant    Allergies:   Allergies  Allergen Reactions  . Sulfa Antibiotics Hives    Prescriptions Prior to Admission  Medication Sig Dispense Refill Last Dose  . aspirin EC 81 MG tablet Take 81 mg by mouth at bedtime.   03/09/2016 at Unknown time  . omeprazole (PRILOSEC) 20 MG capsule Take 20 mg by mouth 2 (two) times daily before a meal.   03/10/2016 at Unknown time  . Prenatal Vit-Fe Fumarate-FA (PRENATAL MULTIVITAMIN) TABS tablet Take 1 tablet by mouth at bedtime.   03/09/2016 at Unknown time  . promethazine (PHENERGAN) 25 MG tablet Take 25 mg by mouth every 6 (six) hours as needed for nausea or vomiting.   Past Week at Unknown time  . PROAIR HFA 108 (90 Base) MCG/ACT inhaler INHALE 2 PUFF(S) EVERY 4 HOURS BY INHALATION ROUTE AS NEEDED FOR SHORTNESS OF BREATH  5 PRN   Results for orders placed or performed during the hospital encounter of 03/10/16 (from the past 48 hour(s))  Urinalysis, Routine w reflex microscopic     Status: Abnormal   Collection Time:  03/10/16  4:13 PM  Result Value Ref Range   Color, Urine YELLOW YELLOW   APPearance CLOUDY (A) CLEAR   Specific Gravity, Urine 1.011 1.005 - 1.030   pH 6.0 5.0 - 8.0   Glucose, UA 50 (A) NEGATIVE mg/dL   Hgb urine dipstick NEGATIVE NEGATIVE   Bilirubin Urine NEGATIVE NEGATIVE   Ketones, ur NEGATIVE NEGATIVE mg/dL   Protein, ur NEGATIVE NEGATIVE mg/dL   Nitrite NEGATIVE NEGATIVE   Leukocytes, UA LARGE (A) NEGATIVE   RBC / HPF 0-5 0 - 5 RBC/hpf   WBC, UA 6-30 0 - 5 WBC/hpf   Bacteria, UA RARE (A) NONE SEEN   Squamous Epithelial / LPF TOO NUMEROUS TO COUNT (A) NONE SEEN   Mucous PRESENT   Protein / creatinine ratio, urine     Status: Abnormal   Collection Time: 03/10/16  4:13 PM  Result Value Ref Range   Creatinine, Urine 60.00 mg/dL   Total Protein, Urine 11 mg/dL    Comment: NO NORMAL RANGE ESTABLISHED FOR THIS TEST   Protein Creatinine Ratio 0.18 (H) 0.00 - 0.15 mg/mg[Cre]  CBC     Status: Abnormal   Collection Time: 03/10/16  6:00 PM  Result  Value Ref Range   WBC 10.8 (H) 4.0 - 10.5 K/uL   RBC 3.79 (L) 3.87 - 5.11 MIL/uL   Hemoglobin 12.2 12.0 - 15.0 g/dL   HCT 34.6 (L) 36.0 - 46.0 %   MCV 91.3 78.0 - 100.0 fL   MCH 32.2 26.0 - 34.0 pg   MCHC 35.3 30.0 - 36.0 g/dL   RDW 12.8 11.5 - 15.5 %   Platelets 272 150 - 400 K/uL  Comprehensive metabolic panel     Status: Abnormal   Collection Time: 03/10/16  6:00 PM  Result Value Ref Range   Sodium 134 (L) 135 - 145 mmol/L   Potassium 3.9 3.5 - 5.1 mmol/L   Chloride 105 101 - 111 mmol/L   CO2 21 (L) 22 - 32 mmol/L   Glucose, Bld 70 65 - 99 mg/dL   BUN 7 6 - 20 mg/dL   Creatinine, Ser 0.41 (L) 0.44 - 1.00 mg/dL   Calcium 8.9 8.9 - 10.3 mg/dL   Total Protein 6.9 6.5 - 8.1 g/dL   Albumin 2.8 (L) 3.5 - 5.0 g/dL   AST 17 15 - 41 U/L   ALT 12 (L) 14 - 54 U/L   Alkaline Phosphatase 139 (H) 38 - 126 U/L   Total Bilirubin 0.2 (L) 0.3 - 1.2 mg/dL   GFR calc non Af Amer >60 >60 mL/min   GFR calc Af Amer >60 >60 mL/min    Comment: (NOTE) The eGFR has been calculated using the CKD EPI equation. This calculation has not been validated in all clinical situations. eGFR's persistently <60 mL/min signify possible Chronic Kidney Disease.    Anion gap 8 5 - 15   Dg Chest 2 View  Result Date: 03/10/2016 CLINICAL DATA:  Shortness of breath for the past 5 days associated with nonproductive cough for 2 weeks. Nonsmoker. The patient is [redacted] weeks pregnant. EXAM: CHEST  2 VIEW COMPARISON:  Report of a chest x-ray dated December 06, 2000 FINDINGS: The lungs are adequately inflated. There is no focal infiltrate. There is no pleural effusion. The heart and pulmonary vascularity are normal. The mediastinum is normal in width. The bony thorax is unremarkable. IMPRESSION: There is no active cardiopulmonary disease. Electronically Signed   By: Kristin  Sellers M.D.   On: 03/10/2016  17:31   Ct Angio Chest Pe W And/or Wo Contrast  Result Date: 03/10/2016 CLINICAL DATA:  Acute onset of shortness of breath and  tachycardia. Patient is [redacted] weeks pregnant. Initial encounter. EXAM: CT ANGIOGRAPHY CHEST WITH CONTRAST TECHNIQUE: Multidetector CT imaging of the chest was performed using the standard protocol during bolus administration of intravenous contrast. Multiplanar CT image reconstructions and MIPs were obtained to evaluate the vascular anatomy. CONTRAST:  100 mL of Isovue 370 IV contrast COMPARISON:  Chest radiograph performed earlier today at 5:12 p.m. FINDINGS: Cardiovascular:  There is no evidence of pulmonary embolus. The heart is normal in size. The thoracic aorta is unremarkable in appearance. The great vessels are unremarkable in appearance. Mediastinum/Nodes: The mediastinum is unremarkable. No mediastinal lymphadenopathy is seen. No pericardial effusion is identified. The visualized portions of the thyroid gland are unremarkable. No axillary lymphadenopathy is seen. Lungs/Pleura: The lungs are clear bilaterally. No focal consolidation, pleural effusion or pneumothorax is seen. No masses are identified. Upper Abdomen: The visualized portions of the liver and spleen are unremarkable. The visualized portions of the gallbladder, pancreas, adrenal glands and kidneys are within normal limits. Slight wall thickening at the distal esophagus is thought to reflect intraluminal contents. Musculoskeletal: No acute osseous abnormalities are identified. The visualized musculature is unremarkable in appearance. Review of the MIP images confirms the above findings. IMPRESSION: 1. No evidence of pulmonary embolus. 2. Lungs clear bilaterally. Electronically Signed   By: Garald Balding M.D.   On: 03/10/2016 19:31   Review of Systems  Constitutional: Negative for chills and fever.  Eyes: Positive for visual disturbance (Seeing black lines for the past 2-3 weeks. ).  Respiratory: Positive for cough and shortness of breath.   Cardiovascular: Negative for chest pain.  Gastrointestinal: Negative for abdominal pain.   Neurological: Positive for headaches (Band like. ).   Physical Exam   Blood pressure 108/79, pulse 108, temperature 98.4 F (36.9 C), temperature source Oral, resp. rate 20, last menstrual period 11/16/2014, SpO2 99 %.  Physical Exam  Constitutional: She is oriented to person, place, and time. She appears well-developed and well-nourished.  Non-toxic appearance. She does not have a sickly appearance. She does not appear ill. No distress.  HENT:  Head: Normocephalic.  Cardiovascular: Normal rate and regular rhythm.   Respiratory: Effort normal and breath sounds normal. No respiratory distress. She has no wheezes. She has no rales. She exhibits no tenderness.  GI: Soft. She exhibits no distension. There is no tenderness.  Genitourinary:  Genitourinary Comments: Dilation: Closed Effacement (%): 20 Cervical Position: Posterior Exam by:: Noni Saupe NP  Musculoskeletal: Normal range of motion.  Neurological: She is alert and oriented to person, place, and time. She displays normal reflexes.  Negative clonus   Skin: Skin is warm. She is not diaphoretic.  Psychiatric: Her behavior is normal.   Fetal Tracing: Baseline: 130 bpm  Variability: Moderate  Accelerations: 15x15 Decelerations: None Toco: Occasional   MAU Course  Procedures  None  MDM  Pulse ox 99% on RA Chest Xray normal EKG normal Discussed with Dr. Harrington Challenger Patient rates her HA pain 3/10- new onset . Will give Fioricet 2 tabs.  Will add PIH labs and CT scan  HA pain level following Fioricet: patient down to 1/10 "feeling much better" She denies Upper abdominal pain or vision changes currently  Discussed CT scan and labs with Dr. Harrington Challenger.   Assessment and Plan   A:  1. Shortness of breath due to pregnancy in third trimester  2. Headache in pregnancy, antepartum, third trimester   3. Gestational hypertension w/o significant proteinuria in 3rd trimester     P:  Discharge home in stable condition Patient to  call Cardiologist tomorrow for follow up.  Return to MAU if symptoms worsen Follow up with OB as scheduled Preeclampsia precautions.  Change positions slowly   Lezlie Lye, NP 03/10/2016 7:57 PM

## 2016-03-10 NOTE — Discharge Instructions (Signed)
General Headache Without Cause A headache is pain or discomfort felt around the head or neck area. The specific cause of a headache may not be found. There are many causes and types of headaches. A few common ones are:  Tension headaches.  Migraine headaches.  Cluster headaches.  Chronic daily headaches. Follow these instructions at home: Watch your condition for any changes. Take these steps to help with your condition: Managing pain  Take over-the-counter and prescription medicines only as told by your health care provider.  Lie down in a dark, quiet room when you have a headache.  If directed, apply ice to the head and neck area:  Put ice in a plastic bag.  Place a towel between your skin and the bag.  Leave the ice on for 20 minutes, 2-3 times per day.  Use a heating pad or hot shower to apply heat to the head and neck area as told by your health care provider.  Keep lights dim if bright lights bother you or make your headaches worse. Eating and drinking  Eat meals on a regular schedule.  Limit alcohol use.  Decrease the amount of caffeine you drink, or stop drinking caffeine. General instructions  Keep all follow-up visits as told by your health care provider. This is important.  Keep a headache journal to help find out what may trigger your headaches. For example, write down:  What you eat and drink.  How much sleep you get.  Any change to your diet or medicines.  Try massage or other relaxation techniques.  Limit stress.  Sit up straight, and do not tense your muscles.  Do not use tobacco products, including cigarettes, chewing tobacco, or e-cigarettes. If you need help quitting, ask your health care provider.  Exercise regularly as told by your health care provider.  Sleep on a regular schedule. Get 7-9 hours of sleep, or the amount recommended by your health care provider. Contact a health care provider if:  Your symptoms are not helped by  medicine.  You have a headache that is different from the usual headache.  You have nausea or you vomit.  You have a fever. Get help right away if:  Your headache becomes severe.  You have repeated vomiting.  You have a stiff neck.  You have a loss of vision.  You have problems with speech.  You have pain in the eye or ear.  You have muscular weakness or loss of muscle control.  You lose your balance or have trouble walking.  You feel faint or pass out.  You have confusion. This information is not intended to replace advice given to you by your health care provider. Make sure you discuss any questions you have with your health care provider. Document Released: 02/03/2005 Document Revised: 07/12/2015 Document Reviewed: 05/29/2014 Elsevier Interactive Patient Education  2017 [employer]  Hypertension During Pregnancy Hypertension is also called high blood pressure. High blood pressure means that the force of your blood moving in your body is too strong. When you are pregnant, this condition should be watched carefully. It can cause problems for you and your baby. Follow these instructions at home: Eating and drinking  Drink enough fluid to keep your pee (urine) clear or pale yellow.  Eat healthy foods that are low in salt (sodium).  Do not add salt to your food.  Check labels on foods and drinks to see much salt is in them. Look on the label where you see "Sodium." Lifestyle  Do not use any products that contain nicotine or tobacco, such as cigarettes and e-cigarettes. If you need help quitting, ask your doctor.  Do not use alcohol.  Avoid caffeine.  Avoid stress. Rest and get plenty of sleep. General instructions  Take over-the-counter and prescription medicines only as told by your doctor.  While lying down, lie on your left side. This keeps pressure off your baby.  While sitting or lying down, raise (elevate) your feet. Try putting some pillows under  your lower legs.  Exercise regularly. Ask your doctor what kinds of exercise are best for you.  Keep all prenatal and follow-up visits as told by your doctor. This is important. Contact a doctor if:  You have symptoms that your doctor told you to watch for, such as:  Fever.  Throwing up (vomiting).  Headache. Get help right away if:  You have very bad pain in your belly (abdomen).  You are throwing up, and this does not get better with treatment.  You suddenly get swelling in your hands, ankles, or face.  You gain 4 lb (1.8 kg) or more in 1 week.  You get bleeding from your vagina.  You have blood in your pee.  You do not feel your baby moving as much as normal.  You have a change in vision.  You have muscle twitching or sudden tightening (spasms).  You have trouble breathing.  Your lips or fingernails turn blue. This information is not intended to replace advice given to you by your health care provider. Make sure you discuss any questions you have with your health care provider. Document Released: 03/08/2010 Document Revised: 10/16/2015 Document Reviewed: 10/16/2015 Elsevier Interactive Patient Education  2017 [employer].

## 2016-03-11 LAB — CULTURE, OB URINE: SPECIAL REQUESTS: NORMAL

## 2016-03-12 ENCOUNTER — Encounter: Payer: Self-pay | Admitting: Cardiovascular Disease

## 2016-03-12 ENCOUNTER — Ambulatory Visit (INDEPENDENT_AMBULATORY_CARE_PROVIDER_SITE_OTHER): Payer: 59 | Admitting: Cardiovascular Disease

## 2016-03-12 VITALS — BP 120/78 | HR 108 | Ht 62.0 in | Wt 186.6 lb

## 2016-03-12 DIAGNOSIS — Z3A35 35 weeks gestation of pregnancy: Secondary | ICD-10-CM | POA: Diagnosis not present

## 2016-03-12 DIAGNOSIS — R0600 Dyspnea, unspecified: Secondary | ICD-10-CM | POA: Diagnosis not present

## 2016-03-12 DIAGNOSIS — E785 Hyperlipidemia, unspecified: Secondary | ICD-10-CM | POA: Diagnosis not present

## 2016-03-12 DIAGNOSIS — R000 Tachycardia, unspecified: Secondary | ICD-10-CM

## 2016-03-12 NOTE — Patient Instructions (Signed)
Medication Instructions:  Your physician recommends that you continue on your current medications as directed. Please refer to the Current Medication list given to you today.  Labwork: NONE  Testing/Procedures: Your physician has requested that you have an echocardiogram. Echocardiography is a painless test that uses sound waves to create images of your heart. It provides your doctor with information about the size and shape of your heart and how well your heart's chambers and valves are working. This procedure takes approximately one hour. There are no restrictions for this procedure. SCHEDULE STAT PER DR KELLY  Follow-Up: Your physician recommends that you schedule a follow-up appointment in: AS NEEDED, WILL BE DETERMINED AFTER ECHO IS COMPLETED  Any Other Special Instructions Will Be Listed Below (If Applicable).  If you need a refill on your cardiac medications before your next appointment, please call your pharmacy.

## 2016-03-12 NOTE — Progress Notes (Signed)
Cardiology Office Note    Date:  03/12/2016   ID:  Kristin, Sellers 1985/04/09, MRN [id number]  PCP:  Olevia Bowens, NP  Cardiologist:  Shelva Majestic, MD   Chief Complaint  Patient presents with  . New Patient (Initial Visit)  . Shortness of Breath    History of Present Illness:  Kristin Sellers is a 31 y.o. female who is referred for cardiology consultation per request of Dr. Vanessa Kick.  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  She had an uncomplicated initial pregnancy with her daughter who is age 53.  She developed preeclampsia at 37 weeks in her second pregnancy and with her blood pressure elevation at 37 weeks, delivery of her second child was done at that time.  She was only daily for 8 weeks postpartum. She tells me she developed transient Bell's palsy at the time of delivery, which ultimately resolved.  She has not been hypertensive since her pregnancy.  She now is pregnant with her third child, with a due date 12/14/2016.  However, for the past week.  There has been some blood pressure fluctuation with mild lability.  Most of the time her blood pressure has remained fairly normal.  She tells me she is to have an induction with his third child on February 8.  She states that she typically has had fast pulses noted in the past and when she was not pregnant.  Her resting pulse typically run in the 90s.  Proximally 8 years ago, she had episodes of tachycardia and transiently had been on metoprolol, which was stopped when she became pregnant with her first child.  Her current pregnancy has been uncomplicated.  She's gained approximately 20-25 pounds.  She has noticed her heart rate typically in the 100-110 range, but rarely.  This has increased to 130 bpm.  Last week she had noticed some mild shortness of breath when getting out of the shower.  She denies any chest tightness.  She is unaware of any significant valvular pathology.  She is now referred for  cardiology consultation.  As her blood pressure most of the time has been fairly normal.  She has not been started on labetalol but her OB/GYN physicians have been monitoring her closely.  She presents for evaluation.  Past Medical History:  Diagnosis Date  . Allergy   . Asthma   . Heart murmur   . History of Bell's palsy   . Hyperlipidemia   . Palpitations   . Pregnancy induced hypertension 2014   GHTN    Past Surgical History:  Procedure Laterality Date  . APPENDECTOMY    . MOUTH SURGERY      Current Medications: Outpatient Medications Prior to Visit  Medication Sig Dispense Refill  . aspirin EC 81 MG tablet Take 81 mg by mouth at bedtime.    [name] omeprazole (PRILOSEC) 20 MG capsule Take 20 mg by mouth 2 (two) times daily before a meal.    . Prenatal Vit-Fe Fumarate-FA (PRENATAL MULTIVITAMIN) TABS tablet Take 1 tablet by mouth at bedtime.    [name] PROAIR HFA 108 (90 Base) MCG/ACT inhaler INHALE 2 PUFF(S) EVERY 4 HOURS BY INHALATION ROUTE AS NEEDED FOR SHORTNESS OF BREATH  5  . promethazine (PHENERGAN) 25 MG tablet Take 25 mg by mouth every 6 (six) hours as needed for nausea or vomiting.     No facility-administered medications prior to visit.      Allergies:  Sulfa antibiotics   Social History   Social History  . Marital status: Married    Spouse name: N/A  . Number of children: 2  . Years of education: N/A   Occupational History  . RN     [employer]  .  [place] History Main Topics  . Smoking status: Never Smoker  . Smokeless tobacco: Never Used  . Alcohol use No     Comment: occasional glass of wine when not pregnant  . Drug use: No  . Sexual activity: Yes    Birth control/ protection: None   Other Topics Concern  . None   Social History Narrative  . None    Digital social history is notable in that there is no tobacco history.  She works as a Marine scientist at [employer].  She attended [employer].  She was boarding [place], but lived most of her life in [place].  Family History:  The patient's family history includes Coronary artery disease in her father; Early death in her paternal grandfather; Heart attack in her father; Heart disease in her father and mother; Hyperlipidemia in her father and mother; Hypertension in her father.   ROS General: Negative; No fevers, chills, or night sweats;  HEENT: Negative; No changes in vision or hearing, sinus congestion, difficulty swallowing Pulmonary: Negative; No cough, wheezing, shortness of breath, hemoptysis Cardiovascular: See history of present illness GI: Negative; No nausea, vomiting, diarrhea, or abdominal pain GU: Negative; No dysuria, hematuria, or difficulty voiding Musculoskeletal: Negative; no myalgias, joint pain, or weakness Hematologic/Oncology: Negative; no easy bruising, bleeding Endocrine: Negative; no heat/cold intolerance; no diabetes Neuro: Negative; no changes in balance, headaches Skin: Negative; No rashes or skin lesions Psychiatric: Negative; No behavioral problems, depression Sleep: Negative; No snoring, daytime sleepiness, hypersomnolence, bruxism, restless legs, hypnogognic hallucinations, no cataplexy Other comprehensive 14 point system review is negative.   PHYSICAL EXAM:   VS:  BP 120/78   Pulse (!) 108   Ht 5' 2" (1.575 m)   Wt 186 lb 9.6 oz (84.6 kg)   LMP 11/16/2014   BMI 34.13 kg/m     Repeat blood pressure by me today was 118/70.  Wt Readings from Last 3 Encounters:  03/12/16 186 lb 9.6 oz (84.6 kg)  02/19/16 188 lb (85.3 kg)  01/03/16 180 lb 12.8 oz (82 kg)    General: Alert, oriented, no distress.  Skin: normal turgor, no rashes, warm and dry HEENT: Normocephalic, atraumatic. Pupils equal round and reactive to light; sclera anicteric; extraocular muscles intact; Fundi Without hemorrhages or exudates Nose without nasal septal hypertrophy Mouth/Parynx benign;  Mallinpatti scale 2 Neck: No JVD, no carotid bruits; normal carotid upstroke Lungs: clear to ausculatation and percussion; no wheezing or rales Chest wall: without tenderness to palpitation Heart: PMI not displaced, RRR, s1 s2 normal, 1/6 systolic murmur, no diastolic murmur, no rubs, gallops, thrills, or heaves Abdomen: soft, nontender; no hepatosplenomehaly, BS+; abdominal aorta nontender and not dilated by palpation.  [redacted] weeks pregnant Back: no CVA tenderness Pulses 2+ Musculoskeletal: full range of motion, normal strength, no joint deformities Extremities: no clubbing cyanosis or edema, Homan's sign negative  Neurologic: grossly nonfocal; Cranial nerves grossly wnl Psychologic: Normal mood and affect   Studies/Labs Reviewed:   EKG:  EKG is ordered today.  The ekg Independently reviewed by me reveals sinus tachycardia at 1 beats per minute.  PR interval 126 ms.  QTc interval 447 ms.  Nonspecific  T abnormality in lead 3.  Recent Labs: BMP Latest Ref Rng & Units 03/10/2016 02/19/2016 01/03/2016  Glucose 65 - 99 mg/dL 70 113(H) 76  BUN 6 - 20 mg/dL _0 Creatinine 0.44 - 1.00 mg/dL 0.41(L) 0.45 0.45  Sodium 135 - 145 mmol/L 134(L) 133(L) 134(L)  Potassium 3.5 - 5.1 mmol/L 3.9 3.4(L) 4.1  Chloride 101 - 111 mmol/L 105 105 105  CO2 22 - 32 mmol/L 21(L) 21(L) 22  Calcium 8.9 - 10.3 mg/dL 8.9 8.1(L) 8.9     Hepatic Function Latest Ref Rng & Units 03/10/2016 02/19/2016 01/03/2016  Total Protein 6.5 - 8.1 g/dL 6.9 6.9 6.9  Albumin 3.5 - 5.0 g/dL 2.8(L) 2.8(L) 2.9(L)  AST 15 - 41 U/L _1 ALT 14 - 54 U/L 12(L) 12(L) 12(L)  Alk Phosphatase 38 - 126 U/L 139(H) 106 102  Total Bilirubin 0.3 - 1.2 mg/dL 0.2(L) 0.1(L) 0.5    CBC Latest Ref Rng & Units 03/10/2016 02/19/2016 01/03/2016  WBC 4.0 - 10.5 K/uL 10.8(H) 7.0 9.0  Hemoglobin 12.0 - 15.0 g/dL 12.2 10.7(L) 12.0  Hematocrit 36.0 - 46.0 % 34.6(L) 30.6(L) 34.5(L)  Platelets 150 - 400 K/uL 272 267 286   Lab Results  Component Value  Date   MCV 91.3 03/10/2016   MCV 92.4 02/19/2016   MCV 93.8 01/03/2016   Lab Results  Component Value Date   TSH 2.69 09/09/2013   No results found for: HGBA1C   BNP No results found for: BNP  ProBNP No results found for: PROBNP   Lipid Panel     Component Value Date/Time   CHOL 195 09/09/2013 0823   TRIG 59.0 09/09/2013 0823   HDL 38.40 (L) 09/09/2013 0823   CHOLHDL 5 09/09/2013 0823   VLDL 11.8 09/09/2013 0823   LDLCALC 145 (H) 09/09/2013 0823     RADIOLOGY: Dg Chest 2 View  Result Date: 03/10/2016 CLINICAL DATA:  Shortness of breath for the past 5 days associated with nonproductive cough for 2 weeks. Nonsmoker. The patient is [redacted] weeks pregnant. EXAM: CHEST  2 VIEW COMPARISON:  Report of a chest x-ray dated December 06, 2000 FINDINGS: The lungs are adequately inflated. There is no focal infiltrate. There is no pleural effusion. The heart and pulmonary vascularity are normal. The mediastinum is normal in width. The bony thorax is unremarkable. IMPRESSION: There is no active cardiopulmonary disease. Electronically Signed   By: David  Martinique M.D.   On: 03/10/2016 17:31   Ct Angio Chest Pe W And/or Wo Contrast  Result Date: 03/10/2016 CLINICAL DATA:  Acute onset of shortness of breath and tachycardia. Patient is [redacted] weeks pregnant. Initial encounter. EXAM: CT ANGIOGRAPHY CHEST WITH CONTRAST TECHNIQUE: Multidetector CT imaging of the chest was performed using the standard protocol during bolus administration of intravenous contrast. Multiplanar CT image reconstructions and MIPs were obtained to evaluate the vascular anatomy. CONTRAST:  100 mL of Isovue 370 IV contrast COMPARISON:  Chest radiograph performed earlier today at 5:12 p.m. FINDINGS: Cardiovascular:  There is no evidence of pulmonary embolus. The heart is normal in size. The thoracic aorta is unremarkable in appearance. The great vessels are unremarkable in appearance. Mediastinum/Nodes: The mediastinum is unremarkable. No  mediastinal lymphadenopathy is seen. No pericardial effusion is identified. The visualized portions of the thyroid gland are unremarkable. No axillary lymphadenopathy is seen. Lungs/Pleura: The lungs are clear bilaterally. No focal consolidation, pleural effusion or pneumothorax is seen. No masses are identified. Upper Abdomen: The visualized portions of the  liver and spleen are unremarkable. The visualized portions of the gallbladder, pancreas, adrenal glands and kidneys are within normal limits. Slight wall thickening at the distal esophagus is thought to reflect intraluminal contents. Musculoskeletal: No acute osseous abnormalities are identified. The visualized musculature is unremarkable in appearance. Review of the MIP images confirms the above findings. IMPRESSION: 1. No evidence of pulmonary embolus. 2. Lungs clear bilaterally. Electronically Signed   By: Jeffery  Chang M.D.   On: 03/10/2016 19:31     Additional studies/ records that were reviewed today include:  I reviewed the referral records from Dr. Ross.    ASSESSMENT:       1. Sinus tachycardia   2. [redacted] weeks gestation of pregnancy with her third child History of preeclampsia with her second child   3. Mild hyperlipidemia   4. 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  She is now pregnant with her third child and is at [redacted] weeks gestation.  Over the past week she has noticed her heart rate being increased in the 100s, but in the past.  She states typically her resting pulse has been in the 90s when she has not been pregnant.  On exam today, she has a short systolic murmur most likely concordant with her increased blood volume associated with her pregnancy.  She has had some transient blood pressure elevations up to 140 but in the office today on 2 occasions.  Her blood pressure was 120/78, and repeat blood pressure was 118/70.  She has not  been on any labetolol therapy during this pregnancy and with her current blood pressure presently normal close follow-up is recommended.  He was started on aspirin 81 mg when she found out she was pregnant with her history of prior preeclampsia to reduce potential placenta induced inflammation and endothelial dysfunction.  With her planned induction date on 03/27/2016, I have recommended that she undergo a 2-D echo Doppler study to assess her LV function and valvular architecture.  Hopefully, her blood pressure will continue to be stable, but if there is continued gradual increase in blood pressure initiation of labetalol may be necessary and plans for possible earlier induction.  I will contact her regarding her echo Doppler findings.  I will be available if problems arise.   Medication Adjustments/Labs and Tests Ordered: Current medicines are reviewed at length with the patient today.  Concerns regarding medicines are outlined above.  Medication changes, Labs and Tests ordered today are listed in the Patient Instructions below. Patient Instructions  Medication Instructions:  Your physician recommends that you continue on your current medications as directed. Please refer to the Current Medication list given to you today.  Labwork: NONE  Testing/Procedures: Your physician has requested that you have an echocardiogram. Echocardiography is a painless test that uses sound waves to create images of your heart. It provides your doctor with information about the size and shape of your heart and how well your heart's chambers and valves are working. This procedure takes approximately one hour. There are no restrictions for this procedure. SCHEDULE STAT PER DR KELLY  Follow-Up: Your physician recommends that you schedule a follow-up appointment in: AS NEEDED, WILL BE DETERMINED AFTER ECHO IS COMPLETED  Any Other Special Instructions Will Be Listed Below (If Applicable).  If you need a refill on your  cardiac medications before your next appointment, please call your pharmacy.       Signed, Thomas Kelly, MD  03/12/2016 6:00 PM    [facility] [address], [address], [place], [place]  [address] Phone: [phone]    

## 2016-03-13 ENCOUNTER — Ambulatory Visit: Payer: Self-pay | Admitting: Cardiovascular Disease

## 2016-03-13 ENCOUNTER — Other Ambulatory Visit: Payer: Self-pay | Admitting: Obstetrics and Gynecology

## 2016-03-13 ENCOUNTER — Other Ambulatory Visit: Payer: Self-pay | Admitting: Obstetrics & Gynecology

## 2016-03-14 ENCOUNTER — Other Ambulatory Visit: Payer: Self-pay

## 2016-03-14 ENCOUNTER — Other Ambulatory Visit: Payer: Self-pay | Admitting: Obstetrics and Gynecology

## 2016-03-14 ENCOUNTER — Ambulatory Visit (HOSPITAL_COMMUNITY): Payer: 59 | Attending: Cardiovascular Disease

## 2016-03-14 DIAGNOSIS — R030 Elevated blood-pressure reading, without diagnosis of hypertension: Secondary | ICD-10-CM | POA: Diagnosis not present

## 2016-03-14 DIAGNOSIS — Z3480 Encounter for supervision of other normal pregnancy, unspecified trimester: Secondary | ICD-10-CM | POA: Diagnosis not present

## 2016-03-14 DIAGNOSIS — Z369 Encounter for antenatal screening, unspecified: Secondary | ICD-10-CM | POA: Diagnosis not present

## 2016-03-14 DIAGNOSIS — R000 Tachycardia, unspecified: Secondary | ICD-10-CM | POA: Insufficient documentation

## 2016-03-14 LAB — OB RESULTS CONSOLE GBS: GBS: NEGATIVE

## 2016-03-17 ENCOUNTER — Encounter: Payer: Self-pay | Admitting: *Deleted

## 2016-03-17 ENCOUNTER — Encounter (HOSPITAL_COMMUNITY): Payer: Self-pay | Admitting: *Deleted

## 2016-03-17 ENCOUNTER — Telehealth (HOSPITAL_COMMUNITY): Payer: Self-pay | Admitting: *Deleted

## 2016-03-17 NOTE — Telephone Encounter (Signed)
Preadmission screen  

## 2016-03-18 DIAGNOSIS — O139 Gestational [pregnancy-induced] hypertension without significant proteinuria, unspecified trimester: Secondary | ICD-10-CM | POA: Diagnosis not present

## 2016-03-21 DIAGNOSIS — O139 Gestational [pregnancy-induced] hypertension without significant proteinuria, unspecified trimester: Secondary | ICD-10-CM | POA: Diagnosis not present

## 2016-03-21 DIAGNOSIS — Z369 Encounter for antenatal screening, unspecified: Secondary | ICD-10-CM | POA: Diagnosis not present

## 2016-03-24 DIAGNOSIS — O133 Gestational [pregnancy-induced] hypertension without significant proteinuria, third trimester: Secondary | ICD-10-CM | POA: Diagnosis not present

## 2016-03-27 ENCOUNTER — Encounter (HOSPITAL_COMMUNITY): Payer: Self-pay | Admitting: Anesthesiology

## 2016-03-27 ENCOUNTER — Encounter (HOSPITAL_COMMUNITY): Payer: Self-pay

## 2016-03-27 ENCOUNTER — Inpatient Hospital Stay (HOSPITAL_COMMUNITY)
Admission: RE | Admit: 2016-03-27 | Discharge: 2016-03-29 | DRG: 775 | Disposition: A | Discharge status: Home / Self Care | Payer: 59 | Source: Ambulatory Visit | Attending: Obstetrics and Gynecology | Admitting: Obstetrics and Gynecology

## 2016-03-27 DIAGNOSIS — Z8249 Family history of ischemic heart disease and other diseases of the circulatory system: Secondary | ICD-10-CM

## 2016-03-27 DIAGNOSIS — O134 Gestational [pregnancy-induced] hypertension without significant proteinuria, complicating childbirth: Secondary | ICD-10-CM | POA: Diagnosis present

## 2016-03-27 DIAGNOSIS — Z3A37 37 weeks gestation of pregnancy: Secondary | ICD-10-CM | POA: Diagnosis not present

## 2016-03-27 DIAGNOSIS — Z3490 Encounter for supervision of normal pregnancy, unspecified, unspecified trimester: Secondary | ICD-10-CM

## 2016-03-27 LAB — COMPREHENSIVE METABOLIC PANEL
ALBUMIN: 2.7 g/dL — AB (ref 3.5–5.0)
ALT: 13 U/L — ABNORMAL LOW (ref 14–54)
ANION GAP: 8 (ref 5–15)
AST: 22 U/L (ref 15–41)
Alkaline Phosphatase: 118 U/L (ref 38–126)
BILIRUBIN TOTAL: 0.5 mg/dL (ref 0.3–1.2)
BUN: 7 mg/dL (ref 6–20)
CHLORIDE: 106 mmol/L (ref 101–111)
CO2: 20 mmol/L — ABNORMAL LOW (ref 22–32)
Calcium: 8.6 mg/dL — ABNORMAL LOW (ref 8.9–10.3)
Creatinine, Ser: 0.49 mg/dL (ref 0.44–1.00)
GFR calc Af Amer: >60 mL/min (ref >60)
GFR calc non Af Amer: >60 mL/min (ref >60)
GLUCOSE: 106 mg/dL — AB (ref 65–99)
POTASSIUM: 3.9 mmol/L (ref 3.5–5.1)
Sodium: 134 mmol/L — ABNORMAL LOW (ref 135–145)
TOTAL PROTEIN: 6.7 g/dL (ref 6.5–8.1)

## 2016-03-27 LAB — CBC
HEMATOCRIT: 33 % — AB (ref 36.0–46.0)
HEMATOCRIT: 33.8 % — AB (ref 36.0–46.0)
HEMOGLOBIN: 11.2 g/dL — AB (ref 12.0–15.0)
HEMOGLOBIN: 11.8 g/dL — AB (ref 12.0–15.0)
MCH: 31.4 pg (ref 26.0–34.0)
MCH: 32 pg (ref 26.0–34.0)
MCHC: 33.9 g/dL (ref 30.0–36.0)
MCHC: 34.9 g/dL (ref 30.0–36.0)
MCV: 92.4 fL (ref 78.0–100.0)
MCV: 91.6 fL (ref 78.0–100.0)
Platelets: 236 10*3/uL (ref 150–400)
Platelets: 236 10*3/uL (ref 150–400)
RBC: 3.57 MIL/uL — AB (ref 3.87–5.11)
RBC: 3.69 MIL/uL — ABNORMAL LOW (ref 3.87–5.11)
RDW: 12.9 % (ref 11.5–15.5)
RDW: 13.2 % (ref 11.5–15.5)
WBC: 8 10*3/uL (ref 4.0–10.5)
WBC: 7.7 10*3/uL (ref 4.0–10.5)

## 2016-03-27 LAB — PROTEIN / CREATININE RATIO, URINE
CREATININE, URINE: 135 mg/dL
PROTEIN CREATININE RATIO: 0.09 mg/mg{creat} (ref 0.00–0.15)
Total Protein, Urine: 12 mg/dL

## 2016-03-27 LAB — TYPE AND SCREEN
ABO/RH(D): O POS
ANTIBODY SCREEN: NEGATIVE

## 2016-03-27 LAB — ABO/RH: ABO/RH(D): O POS

## 2016-03-27 MED ORDER — MISOPROSTOL 25 MCG QUARTER TABLET
25.0000 ug | ORAL_TABLET | ORAL | Status: DC | PRN
  Administered 2016-03-27: 25 ug via VAGINAL
  Filled 2016-03-27: qty 1
  Filled 2016-03-27: qty 0.25

## 2016-03-27 MED ORDER — OXYCODONE-ACETAMINOPHEN 5-325 MG PO TABS: 1.0000 | ORAL_TABLET | ORAL | Status: DC | PRN

## 2016-03-27 MED ORDER — DIBUCAINE 1 % RE OINT: 1.0000 "application " | TOPICAL_OINTMENT | RECTAL | Status: DC | PRN

## 2016-03-27 MED ORDER — TETANUS-DIPHTH-ACELL PERTUSSIS 5-2.5-18.5 LF-MCG/0.5 IM SUSP: 0.5000 mL | Freq: Once | INTRAMUSCULAR | Status: DC

## 2016-03-27 MED ORDER — OXYCODONE-ACETAMINOPHEN 5-325 MG PO TABS: 2.0000 | ORAL_TABLET | ORAL | Status: DC | PRN

## 2016-03-27 MED ORDER — LACTATED RINGERS IV SOLN: 500.0000 mL | Freq: Once | INTRAVENOUS | Status: DC

## 2016-03-27 MED ORDER — EPHEDRINE 5 MG/ML INJ
10.0000 mg | INTRAVENOUS | Status: DC | PRN
  Filled 2016-03-27: qty 4

## 2016-03-27 MED ORDER — SENNOSIDES-DOCUSATE SODIUM 8.6-50 MG PO TABS
2.0000 | ORAL_TABLET | ORAL | Status: DC
  Administered 2016-03-27 – 2016-03-29 (×2): 2 via ORAL
  Filled 2016-03-27 (×2): qty 2

## 2016-03-27 MED ORDER — LIDOCAINE HCL (PF) 1 % IJ SOLN
30.0000 mL | INTRAMUSCULAR | Status: DC | PRN
  Filled 2016-03-27: qty 30

## 2016-03-27 MED ORDER — WITCH HAZEL-GLYCERIN EX PADS: 1.0000 "application " | MEDICATED_PAD | CUTANEOUS | Status: DC | PRN

## 2016-03-27 MED ORDER — BENZOCAINE-MENTHOL 20-0.5 % EX AERO
1.0000 "application " | INHALATION_SPRAY | CUTANEOUS | Status: DC | PRN
  Filled 2016-03-27: qty 56

## 2016-03-27 MED ORDER — TERBUTALINE SULFATE 1 MG/ML IJ SOLN
0.2500 mg | Freq: Once | INTRAMUSCULAR | Status: DC | PRN
  Filled 2016-03-27: qty 1

## 2016-03-27 MED ORDER — ACETAMINOPHEN 325 MG PO TABS
650.0000 mg | ORAL_TABLET | ORAL | Status: DC | PRN
  Administered 2016-03-27: 650 mg via ORAL
  Filled 2016-03-27: qty 2

## 2016-03-27 MED ORDER — ONDANSETRON HCL 4 MG PO TABS: 4.0000 mg | ORAL_TABLET | ORAL | Status: DC | PRN

## 2016-03-27 MED ORDER — ACETAMINOPHEN 325 MG PO TABS: 650.0000 mg | ORAL_TABLET | ORAL | Status: DC | PRN

## 2016-03-27 MED ORDER — TERBUTALINE SULFATE 1 MG/ML IJ SOLN: 0.2500 mg | Freq: Once | INTRAMUSCULAR | Status: DC | PRN

## 2016-03-27 MED ORDER — ONDANSETRON HCL 4 MG/2ML IJ SOLN: 4.0000 mg | INTRAMUSCULAR | Status: DC | PRN

## 2016-03-27 MED ORDER — ONDANSETRON HCL 4 MG/2ML IJ SOLN
4.0000 mg | Freq: Four times a day (QID) | INTRAMUSCULAR | Status: DC | PRN
  Administered 2016-03-27: 4 mg via INTRAVENOUS
  Filled 2016-03-27: qty 2

## 2016-03-27 MED ORDER — OXYTOCIN 40 UNITS IN LACTATED RINGERS INFUSION - SIMPLE MED
1.0000 m[IU]/min | INTRAVENOUS | Status: DC
  Administered 2016-03-27: 2 m[IU]/min via INTRAVENOUS

## 2016-03-27 MED ORDER — MISOPROSTOL 25 MCG QUARTER TABLET: 25.0000 ug | ORAL_TABLET | ORAL | Status: DC | PRN

## 2016-03-27 MED ORDER — OXYTOCIN 40 UNITS IN LACTATED RINGERS INFUSION - SIMPLE MED
2.5000 [IU]/h | INTRAVENOUS | Status: DC
  Administered 2016-03-27: 2.5 [IU]/h via INTRAVENOUS
  Filled 2016-03-27: qty 1000

## 2016-03-27 MED ORDER — FLEET ENEMA 7-19 GM/118ML RE ENEM: 1.0000 | ENEMA | RECTAL | Status: DC | PRN

## 2016-03-27 MED ORDER — SOD CITRATE-CITRIC ACID 500-334 MG/5ML PO SOLN: 30.0000 mL | ORAL | Status: DC | PRN

## 2016-03-27 MED ORDER — SIMETHICONE 80 MG PO CHEW: 80.0000 mg | CHEWABLE_TABLET | ORAL | Status: DC | PRN

## 2016-03-27 MED ORDER — COCONUT OIL OIL
1.0000 "application " | TOPICAL_OIL | Status: DC | PRN
  Administered 2016-03-29: 1 via TOPICAL
  Filled 2016-03-27: qty 120

## 2016-03-27 MED ORDER — PHENYLEPHRINE 40 MCG/ML (10ML) SYRINGE FOR IV PUSH (FOR BLOOD PRESSURE SUPPORT)
80.0000 ug | PREFILLED_SYRINGE | INTRAVENOUS | Status: DC | PRN
  Filled 2016-03-27: qty 5
  Filled 2016-03-27: qty 10

## 2016-03-27 MED ORDER — DIPHENHYDRAMINE HCL 50 MG/ML IJ SOLN
12.5000 mg | INTRAMUSCULAR | Status: DC | PRN
Start: 2016-03-27 — End: 2016-03-27

## 2016-03-27 MED ORDER — ZOLPIDEM TARTRATE 5 MG PO TABS
5.0000 mg | ORAL_TABLET | Freq: Every evening | ORAL | Status: DC | PRN
Start: 2016-03-27 — End: 2016-03-29

## 2016-03-27 MED ORDER — HYDRALAZINE HCL 20 MG/ML IJ SOLN: 10.0000 mg | Freq: Once | INTRAMUSCULAR | Status: DC | PRN

## 2016-03-27 MED ORDER — LACTATED RINGERS IV SOLN
INTRAVENOUS | Status: DC
  Administered 2016-03-27 (×3): via INTRAVENOUS

## 2016-03-27 MED ORDER — PRENATAL MULTIVITAMIN CH
1.0000 | ORAL_TABLET | Freq: Every day | ORAL | Status: DC
Start: 2016-03-28 — End: 2016-03-29
  Administered 2016-03-28: 1 via ORAL
  Filled 2016-03-27: qty 1

## 2016-03-27 MED ORDER — LABETALOL HCL 5 MG/ML IV SOLN: 20.0000 mg | INTRAVENOUS | Status: DC | PRN

## 2016-03-27 MED ORDER — OXYTOCIN BOLUS FROM INFUSION
500.0000 mL | Freq: Once | INTRAVENOUS | Status: AC
  Administered 2016-03-27: 500 mL via INTRAVENOUS

## 2016-03-27 MED ORDER — IBUPROFEN 600 MG PO TABS
600.0000 mg | ORAL_TABLET | Freq: Four times a day (QID) | ORAL | Status: DC
  Administered 2016-03-27 – 2016-03-29 (×7): 600 mg via ORAL
  Filled 2016-03-27 (×7): qty 1

## 2016-03-27 MED ORDER — FENTANYL 2.5 MCG/ML BUPIVACAINE 1/10 % EPIDURAL INFUSION (WH - ANES)
14.0000 mL/h | INTRAMUSCULAR | Status: DC | PRN
  Filled 2016-03-27: qty 100

## 2016-03-27 MED ORDER — LACTATED RINGERS IV SOLN: 500.0000 mL | INTRAVENOUS | Status: DC | PRN

## 2016-03-27 MED ORDER — DIPHENHYDRAMINE HCL 25 MG PO CAPS: 25.0000 mg | ORAL_CAPSULE | Freq: Four times a day (QID) | ORAL | Status: DC | PRN

## 2016-03-27 NOTE — Anesthesia Pain Management Evaluation Note (Signed)
  CRNA Pain Management Visit Note  Patient: Kristin Sellers, 31 y.o., female  "Hello I am a member of the anesthesia team at [facility]. We have an anesthesia team available at all times to provide care throughout the hospital, including epidural management and anesthesia for C-section. I don't know your plan for the delivery whether it a natural birth, water birth, IV sedation, nitrous supplementation, doula or epidural, but we want to meet your pain goals."   1.Was your pain managed to your expectations on prior hospitalizations?   Yes   2.What is your expectation for pain management during this hospitalization?     Epidural  3.How can we help you reach that goal?   Record the patient's initial score and the patient's pain goal.   Pain: 5  Pain Goal: 7 [facility] wants you to be able to say your pain was always managed very well.  Kristin Sellers,Kristin Sellers 03/27/2016

## 2016-03-27 NOTE — Lactation Note (Signed)
This note was copied from a baby's chart. Lactation Consultation Note  Patient Name: Boy Kristin DingwallKendra Sellers ZOXWR'UToday's Date: 03/27/2016 Reason for consult: Initial assessment   Initial consult at 1 hr old; GA 37.1 early term; Vaginal; BW 6 lbs, 13 oz.  Mom is a P3 with 4 months experience with first but did not bf second child d/t pregnancy and birth complications (per mom).  Hx PIH.   Infant was just weighed when LC entered room.  Infant had not latched yet. Attempted latching infant; infant would bob head and put mouth over nipple but then would fall asleep. Attempted in cross-cradle but mom's breast tissue firm and areolas not very compressible. LC put mom in laid-back position to try to get infant to feel nipple deeper in mouth to elicit sucking reflex.  Infant would root and put mouth on nipple but would not suck.   Taught hand expression with return demonstration and observation of colostrum appearing on nipple.   Spoons, colostrum collection container, and curved-tip syringe given for giving EBM before or after feeding.   Gave Green LPTI sheet with basic information to create environment conducive for breastfeeding early term infant.   Discussed typical early term/LPTI behaviors and to awaken infant for feedings if no cues have been shown in past 2.5-3 hrs. Educated infant should feed 8-12 times / day  Instructions given to try to give extra colostrum with each feeding. Lactation brochure given; Informed of hospital support group and OP services. Encouraged to call for assistance with breastfeeding as needed.     Maternal Data Has patient been taught Hand Expression?: Yes Does the patient have breastfeeding experience prior to this delivery?: Yes  Feeding Feeding Type: Breast Fed  LATCH Score/Interventions Latch: Too sleepy or reluctant, no latch achieved, no sucking elicited.     Type of Nipple: Everted at rest and after stimulation  Comfort (Breast/Nipple): Soft / non-tender      Hold (Positioning): Assistance needed to correctly position infant at breast and maintain latch. Intervention(s): Breastfeeding basics reviewed;Skin to skin     Lactation Tools Discussed/Used     Consult Status Consult Status: Follow-up Date: 03/28/16 Follow-up type: In-patient    Lendon KaVann, Jakyia Gaccione Walker 03/27/2016, 4:31 PM

## 2016-03-27 NOTE — Anesthesia Preprocedure Evaluation (Signed)
Anesthesia Evaluation  Patient identified by MRN, date of birth, ID band Patient awake    Reviewed: Allergy & Precautions, H&P , Patient's Chart, lab work & pertinent test results  History of Anesthesia Complications (+) AWARENESS UNDER ANESTHESIA  Airway Mallampati: II  TM Distance: >3 FB Neck ROM: full    Dental no notable dental hx.    Pulmonary    Pulmonary exam normal        Cardiovascular hypertension, negative cardio ROS Normal cardiovascular exam     Neuro/Psych    GI/Hepatic negative GI ROS, Neg liver ROS,   Endo/Other  negative endocrine ROS  Renal/GU negative Renal ROS     Musculoskeletal   Abdominal (+) + obese,   Peds  Hematology negative hematology ROS (+)   Anesthesia Other Findings   Reproductive/Obstetrics (+) Pregnancy                             Anesthesia Physical Anesthesia Plan  ASA: II  Anesthesia Plan: Epidural   Post-op Pain Management:    Induction:   Airway Management Planned:   Additional Equipment:   Intra-op Plan:   Post-operative Plan:   Informed Consent: I have reviewed the patients History and Physical, chart, labs and discussed the procedure including the risks, benefits and alternatives for the proposed anesthesia with the patient or authorized representative who has indicated his/her understanding and acceptance.     Plan Discussed with:   Anesthesia Plan Comments:         Anesthesia Quick Evaluation

## 2016-03-27 NOTE — Anesthesia Postprocedure Evaluation (Signed)
Anesthesia Post Note  Patient: Kristin Sellers  Procedure(s) Performed: * No procedures listed *  Patient location during evaluation: Mother Baby Anesthesia Type: Epidural Level of consciousness: awake and alert Pain management: satisfactory to patient Vital Signs Assessment: post-procedure vital signs reviewed and stable Respiratory status: respiratory function stable Cardiovascular status: stable Postop Assessment: no headache, no backache, epidural receding, patient able to bend at knees, no signs of nausea or vomiting and adequate PO intake Anesthetic complications: no        Last Vitals:  Vitals:   03/27/16 1716 03/27/16 1842  BP: 111/73 119/86  Pulse: 88 91  Resp: 20 20  Temp: 36.9 C 36.7 C    Last Pain:  Vitals:   03/27/16 1842  TempSrc: Oral  PainSc:    Pain Goal: Patients Stated Pain Goal: 7 (03/27/16 0044)               Karleen DolphinFUSSELL,Dion Parrow

## 2016-03-27 NOTE — H&P (Signed)
Kristin Sellers is a 31 y.o. female presenting for induction of labor for gestational hypertension  31 yo G3P2002 @ 37+1 presents for IOL for gestational hypertension. Her pregnancy was complicated by an episode of lightheadedness and tachycardia at 34 weeks. She had an evaluation for Pre-eclampsia and pulmonary embolism with CT PA and all was normal. She followed up with her cardiologist and had an echo that was also normal  OB History    Gravida Para Term Preterm AB Living   3 2 2  0 0 2   SAB TAB Ectopic Multiple Live Births   0 0 0 0 2     Past Medical History:  Diagnosis Date  . Allergy   . Asthma   . Heart murmur   . History of Bell's palsy   . History of pre-eclampsia   . Hyperlipidemia   . Palpitations   . Pregnancy induced hypertension 2014   GHTN   Past Surgical History:  Procedure Laterality Date  . APPENDECTOMY    . MOUTH SURGERY     Family History: family history includes Coronary artery disease in her father; Early death in her paternal grandfather; Heart attack in her father; Heart disease in her father and mother; Hyperlipidemia in her father and mother; Hypertension in her father. Social History:  reports that she has never smoked. She has never used smokeless tobacco. She reports that she does not drink alcohol or use drugs.     Maternal Diabetes: No Genetic Screening: Normal Maternal Ultrasounds/Referrals: Normal Fetal Ultrasounds or other Referrals:  None Maternal Substance Abuse:  No Significant Maternal Medications:  None Significant Maternal Lab Results:  None Other Comments:  None  ROS History Dilation: (P) 10 Effacement (%): 80 Station: -1 Exam by:: k fields, rn Blood pressure (!) 113/57, pulse (!) 105, temperature 98.7 F (37.1 C), temperature source Oral, resp. rate 16, height 5\' 2"  (1.575 m), weight 83.9 kg (185 lb), last menstrual period 11/16/2014, SpO2 99 %. Exam Physical Exam  Prenatal labs: ABO, Rh: --/--/O POS, O POS (02/08  0130) Antibody: NEG (02/08 0130) Rubella: Immune (07/20 0000) RPR: Nonreactive (07/20 0000)  HBsAg: Negative (07/20 0000)  HIV: Non-reactive (07/20 0000)  GBS: Negative (01/26 0000)   Assessment/Plan: 1) Admit 2) Cytotech followed by Pitocin augmentation 3) Epidural on request    Kristin Sellers,Bryella H. 03/27/2016, 3:21 PM

## 2016-03-28 LAB — CBC
HEMATOCRIT: 30.6 % — AB (ref 36.0–46.0)
Hemoglobin: 10.6 g/dL — ABNORMAL LOW (ref 12.0–15.0)
MCH: 31.6 pg (ref 26.0–34.0)
MCHC: 34.6 g/dL (ref 30.0–36.0)
MCV: 91.3 fL (ref 78.0–100.0)
PLATELETS: 212 10*3/uL (ref 150–400)
RBC: 3.35 MIL/uL — ABNORMAL LOW (ref 3.87–5.11)
RDW: 13.3 % (ref 11.5–15.5)
WBC: 10.1 10*3/uL (ref 4.0–10.5)

## 2016-03-28 LAB — RPR: RPR Ser Ql: NONREACTIVE

## 2016-03-28 NOTE — Progress Notes (Signed)
Patient is eating, ambulating, voiding.  Pain control is good.  Vitals:   03/27/16 1842 03/27/16 2100 03/27/16 2259 03/28/16 0626  BP: 119/86 112/81 104/70 99/62  Pulse: 91 (!) 107 82 78  Resp: 20 20 16 18   Temp: 98 F (36.7 C)  98 F (36.7 C) 97.9 F (36.6 C)  TempSrc: Oral  Oral   SpO2:   99%   Weight:      Height:        Fundus firm Perineum without swelling.  Lab Results  Component Value Date   WBC PENDING 03/28/2016   HGB 10.6 (L) 03/28/2016   HCT 30.6 (L) 03/28/2016   MCV 91.3 03/28/2016   PLT 212 03/28/2016    --/--/O POS, O POS (02/08 0130)/RI  Sellers/P Post partum day 1.  Routine care.  Expect d/c routine.  Parents desires circumsision.  All risks, benefits and alternatives discussed with the mother.  Kristin Sellers

## 2016-03-28 NOTE — Discharge Summary (Signed)
Obstetric Discharge Summary Reason for Admission: [facility] P[facility] Procedures: NST Intrapartum Procedures: spontaneous vaginal delivery Postpartum Procedures: none Complications-Operative and Postpartum: 1 degree perineal laceration Hemoglobin  Date Value Ref Range Status  03/28/2016 10.6 (L) 12.0 - 15.0 g/dL Final   HCT  Date Value Ref Range Status  03/28/2016 30.6 (L) 36.0 - 46.0 % Final    Discharge Diagnoses: Term Pregnancy-delivered  Discharge Information: Date: 03/28/2016 Activity: pelvic rest Diet: routine Medications: Ibuprofen Condition: stable Instructions: refer to practice specific booklet Discharge to: home Follow-up Information    Kristin Sellers,Kristin H., MD Follow up in 4 week(s).   Specialty:  Obstetrics and Gynecology Contact information: [address] [place] [address] [phone]           Newborn Data: Live born female  Birth Weight: 6 lb 13.5 oz (3105 g) APGAR: 8, 9  Home with mother.  Kristin Sellers A 03/28/2016, 7:48 AM

## 2016-03-28 NOTE — Lactation Note (Signed)
This note was copied from a baby's chart. Lactation Consultation Note  Patient Name: Kristin Sellers BMWUX'LToday's Date: 03/28/2016 Reason for consult: Follow-up assessment (early term)  Baby at 22 hrs and Mom states he has been very sleepy at the breast.  Mom has been placing baby STS and baby will open and latch, but falls asleep.  Baby has had 6 voids and 5 stools.  DEBP set up and Mom pumped once and obtained 4 ml.  Assisted with positioning and latching.  Baby placed cross cradle STS, after Mom initially started with cradle hold.  Mom has short shafted dimpled nipples.  Assisted with sandwiching breast to facilitate a deeper latch.  Baby opened widely and latched onto areola, but didn't suck once as he fell asleep.  Demonstrated use of curved tip syringe while baby sucking on finger.  Baby took 4 ml of colostrum well. LPT guidelines given.  Discussed early term and late preterm infant newborn behavior and affect on breastfeeding.  Discussed need for supplementation of EBM+/Alimentum formula.  Discussed paced bottle feeding method.  Mom and FOB aware of supplementation of 5-10 ml first 24 hrs, and then to increase to 10-20 ml.  Mom to use breast massage, and hand expression along with double pumping.  Encouraged STS as much as possible and try to BF every 3 hrs or sooner if he cues. Reassurance given that baby was acting normal for his gestation of 6643w1d.   Mom to ask for help with latch, and supplementing as needed. Lactation to follow up in am and prn.  Consult Status Consult Status: Follow-up Date: 03/29/16 Follow-up type: In-patient    [name], [name] 03/28/2016, 2:08 PM

## 2016-03-28 NOTE — Progress Notes (Signed)
CSW received consult for hx of anxiety and reviewed MOB's medical record.  There is no dx of anxiety noted in H&P or PNR, nor [facility]  The concern for anxiety appears to be situational as CSW notes that MOB had a period of anxiety specifically related to a child's health concern.  CSW screening out referral at this time.  Please call CSW if current concerns arise or by MOB's request.

## 2016-03-29 NOTE — Progress Notes (Signed)
Patient is eating, ambulating, voiding.  Pain control is good.  Vitals:   03/27/16 2259 03/28/16 0626 03/28/16 1739 03/29/16 0636  BP: 104/70 99/62 107/74 (!) 96/52  Pulse: 82 78 83 71  Resp: 16 18 17 18   Temp: 98 F (36.7 C) 97.9 F (36.6 C) 97.9 F (36.6 C) 97.9 F (36.6 C)  TempSrc: Oral  Axillary Oral  SpO2: 99%     Weight:      Height:        Fundus firm Perineum without swelling.  Lab Results  Component Value Date   WBC 10.1 03/28/2016   HGB 10.6 (L) 03/28/2016   HCT 30.6 (L) 03/28/2016   MCV 91.3 03/28/2016   PLT 212 03/28/2016    --/--/O POS, O POS (02/08 0130)/RI  A/P Post partum day 2.  Routine care.  Expect d/c today.    Linnae Rasool A

## 2016-03-29 NOTE — Lactation Note (Signed)
This note was copied from a baby's chart. Lactation Consultation Note  Baby 42 hours.  48106w1d. Parents feel comfortable with feeding plan. Provided family w/ another curved tip syringe and discussed the option as volume increases of using a slow flow nipple. Parents have supplementation guidelines.  Will use pumped breastmilk first and the difference w/ formula. Discussed waking techniques.  Mom encouraged to feed baby 8-12 times/24 hours and with feeding cues at least q 3 hours. Encouraged STS. Reviewed engorgement care and monitoring voids/stools. No questions or concerns from parents at this time.  Offered OP appt. Parents declined at this time.  Patient Name: Kristin Lysle DingwallKendra Jacque UJWJX'BToday's Date: 03/29/2016     Maternal Data    Feeding Feeding Type: Formula Nipple Type: Slow - flow  LATCH Score/Interventions                      Lactation Tools Discussed/Used     Consult Status      Hardie PulleyBerkelhammer, Ruth Boschen 03/29/2016, 9:28 AM

## 2016-04-29 ENCOUNTER — Other Ambulatory Visit: Payer: Self-pay | Admitting: Obstetrics and Gynecology

## 2016-04-29 DIAGNOSIS — Z029 Encounter for administrative examinations, unspecified: Secondary | ICD-10-CM | POA: Diagnosis not present

## 2016-04-29 DIAGNOSIS — Z124 Encounter for screening for malignant neoplasm of cervix: Secondary | ICD-10-CM | POA: Diagnosis not present

## 2016-04-30 LAB — CYTOLOGY - PAP

## 2016-05-26 ENCOUNTER — Encounter (HOSPITAL_COMMUNITY): Payer: Self-pay | Admitting: *Deleted

## 2016-06-12 ENCOUNTER — Ambulatory Visit (HOSPITAL_COMMUNITY): Payer: 59 | Admitting: Certified Registered Nurse Anesthetist

## 2016-06-12 ENCOUNTER — Encounter (HOSPITAL_COMMUNITY): Payer: Self-pay

## 2016-06-12 ENCOUNTER — Encounter (HOSPITAL_COMMUNITY)
Admission: RE | Disposition: A | Discharge status: Home / Self Care | Payer: Self-pay | Source: Ambulatory Visit | Attending: Obstetrics and Gynecology

## 2016-06-12 ENCOUNTER — Ambulatory Visit (HOSPITAL_COMMUNITY)
Admission: RE | Admit: 2016-06-12 | Discharge: 2016-06-12 | Disposition: A | Discharge status: Home / Self Care | Payer: 59 | Source: Ambulatory Visit | Attending: Obstetrics and Gynecology | Admitting: Obstetrics and Gynecology

## 2016-06-12 ENCOUNTER — Other Ambulatory Visit: Payer: Self-pay | Admitting: Obstetrics and Gynecology

## 2016-06-12 DIAGNOSIS — Z7951 Long term (current) use of inhaled steroids: Secondary | ICD-10-CM | POA: Insufficient documentation

## 2016-06-12 DIAGNOSIS — Z302 Encounter for sterilization: Secondary | ICD-10-CM | POA: Diagnosis not present

## 2016-06-12 DIAGNOSIS — J45909 Unspecified asthma, uncomplicated: Secondary | ICD-10-CM | POA: Diagnosis not present

## 2016-06-12 HISTORY — PX: LAPAROSCOPIC BILATERAL SALPINGECTOMY: SHX5889

## 2016-06-12 HISTORY — DX: Bronchitis, not specified as acute or chronic: J40

## 2016-06-12 LAB — PREGNANCY, URINE: Preg Test, Ur: NEGATIVE

## 2016-06-12 SURGERY — SALPINGECTOMY, BILATERAL, LAPAROSCOPIC
Anesthesia: General | Site: Abdomen | Laterality: Bilateral

## 2016-06-12 MED ORDER — LIDOCAINE HCL (CARDIAC) 20 MG/ML IV SOLN
INTRAVENOUS | Status: DC | PRN
  Administered 2016-06-12: 80 mg via INTRAVENOUS

## 2016-06-12 MED ORDER — BUPIVACAINE HCL 0.25 % IJ SOLN
INTRAMUSCULAR | Status: DC | PRN
  Administered 2016-06-12: 13 mL

## 2016-06-12 MED ORDER — ONDANSETRON HCL 4 MG/2ML IJ SOLN
INTRAMUSCULAR | Status: DC | PRN
Start: 2016-06-12 — End: 2016-06-12
  Administered 2016-06-12: 4 mg via INTRAVENOUS

## 2016-06-12 MED ORDER — DEXAMETHASONE SODIUM PHOSPHATE 10 MG/ML IJ SOLN
INTRAMUSCULAR | Status: AC
  Filled 2016-06-12: qty 1

## 2016-06-12 MED ORDER — PROMETHAZINE HCL 25 MG/ML IJ SOLN: 6.2500 mg | INTRAMUSCULAR | Status: DC | PRN

## 2016-06-12 MED ORDER — LIDOCAINE-EPINEPHRINE (PF) 1 %-1:200000 IJ SOLN
INTRAMUSCULAR | Status: AC
  Filled 2016-06-12: qty 30

## 2016-06-12 MED ORDER — ONDANSETRON HCL 4 MG/2ML IJ SOLN
INTRAMUSCULAR | Status: AC
  Filled 2016-06-12: qty 2

## 2016-06-12 MED ORDER — SCOPOLAMINE 1 MG/3DAYS TD PT72: 1.0000 | MEDICATED_PATCH | Freq: Once | TRANSDERMAL | Status: DC

## 2016-06-12 MED ORDER — FENTANYL CITRATE (PF) 100 MCG/2ML IJ SOLN
INTRAMUSCULAR | Status: DC | PRN
  Administered 2016-06-12: 100 ug via INTRAVENOUS
  Administered 2016-06-12 (×3): 50 ug via INTRAVENOUS

## 2016-06-12 MED ORDER — ROCURONIUM BROMIDE 100 MG/10ML IV SOLN
INTRAVENOUS | Status: AC
  Filled 2016-06-12: qty 1

## 2016-06-12 MED ORDER — FENTANYL CITRATE (PF) 250 MCG/5ML IJ SOLN
INTRAMUSCULAR | Status: AC
  Filled 2016-06-12: qty 5

## 2016-06-12 MED ORDER — DEXAMETHASONE SODIUM PHOSPHATE 10 MG/ML IJ SOLN
INTRAMUSCULAR | Status: DC | PRN
Start: 2016-06-12 — End: 2016-06-12
  Administered 2016-06-12: 10 mg via INTRAVENOUS

## 2016-06-12 MED ORDER — LACTATED RINGERS IV SOLN
INTRAVENOUS | Status: DC
  Administered 2016-06-12 (×2): via INTRAVENOUS

## 2016-06-12 MED ORDER — MIDAZOLAM HCL 2 MG/2ML IJ SOLN
INTRAMUSCULAR | Status: DC | PRN
  Administered 2016-06-12: 2 mg via INTRAVENOUS

## 2016-06-12 MED ORDER — SUGAMMADEX SODIUM 200 MG/2ML IV SOLN
INTRAVENOUS | Status: AC
  Filled 2016-06-12: qty 2

## 2016-06-12 MED ORDER — FENTANYL CITRATE (PF) 100 MCG/2ML IJ SOLN
INTRAMUSCULAR | Status: AC
  Filled 2016-06-12: qty 2

## 2016-06-12 MED ORDER — KETOROLAC TROMETHAMINE 30 MG/ML IJ SOLN
INTRAMUSCULAR | Status: AC
  Filled 2016-06-12: qty 1

## 2016-06-12 MED ORDER — LACTATED RINGERS IV SOLN: INTRAVENOUS | Status: DC

## 2016-06-12 MED ORDER — ROCURONIUM BROMIDE 100 MG/10ML IV SOLN
INTRAVENOUS | Status: DC | PRN
  Administered 2016-06-12: 35 mg via INTRAVENOUS

## 2016-06-12 MED ORDER — BUPIVACAINE HCL (PF) 0.25 % IJ SOLN
INTRAMUSCULAR | Status: AC
  Filled 2016-06-12: qty 30

## 2016-06-12 MED ORDER — SCOPOLAMINE 1 MG/3DAYS TD PT72
1.0000 | MEDICATED_PATCH | TRANSDERMAL | Status: DC
  Administered 2016-06-12: 1.5 mg via TRANSDERMAL

## 2016-06-12 MED ORDER — OXYCODONE-ACETAMINOPHEN 5-325 MG PO TABS: 1.0000 | ORAL_TABLET | ORAL | 0 refills | Status: DC | PRN

## 2016-06-12 MED ORDER — PROPOFOL 10 MG/ML IV BOLUS
INTRAVENOUS | Status: AC
  Filled 2016-06-12: qty 20

## 2016-06-12 MED ORDER — IBUPROFEN 600 MG PO TABS: 600.0000 mg | ORAL_TABLET | Freq: Four times a day (QID) | ORAL | 0 refills | Status: DC | PRN

## 2016-06-12 MED ORDER — MIDAZOLAM HCL 2 MG/2ML IJ SOLN
INTRAMUSCULAR | Status: AC
  Filled 2016-06-12: qty 2

## 2016-06-12 MED ORDER — SCOPOLAMINE 1 MG/3DAYS TD PT72
MEDICATED_PATCH | TRANSDERMAL | Status: DC
Start: 2016-06-12 — End: 2016-06-12
  Filled 2016-06-12: qty 1

## 2016-06-12 MED ORDER — SUGAMMADEX SODIUM 200 MG/2ML IV SOLN
INTRAVENOUS | Status: DC | PRN
  Administered 2016-06-12: 163.2 mg via INTRAVENOUS

## 2016-06-12 MED ORDER — FENTANYL CITRATE (PF) 100 MCG/2ML IJ SOLN
25.0000 ug | INTRAMUSCULAR | Status: DC | PRN
  Administered 2016-06-12: 50 ug via INTRAVENOUS

## 2016-06-12 MED ORDER — PROPOFOL 10 MG/ML IV BOLUS
INTRAVENOUS | Status: DC | PRN
  Administered 2016-06-12: 150 mg via INTRAVENOUS

## 2016-06-12 MED ORDER — LIDOCAINE HCL (CARDIAC) 20 MG/ML IV SOLN
INTRAVENOUS | Status: AC
  Filled 2016-06-12: qty 5

## 2016-06-12 SURGICAL SUPPLY — 32 items
ADH SKN CLS APL DERMABOND .7 (GAUZE/BANDAGES/DRESSINGS) ×1
BAG SPEC RTRVL LRG 6X4 10 (ENDOMECHANICALS) ×1
CANISTER SUCT 3000ML PPV (MISCELLANEOUS) ×3 IMPLANT
CLOTH BEACON ORANGE TIMEOUT ST (SAFETY) ×3 IMPLANT
CONT PATH 16OZ SNAP LID 3702 (MISCELLANEOUS) IMPLANT
COVER LIGHT HANDLE  1/PK (MISCELLANEOUS) ×2
COVER LIGHT HANDLE 1/PK (MISCELLANEOUS) IMPLANT
DECANTER SPIKE VIAL GLASS SM (MISCELLANEOUS) ×4 IMPLANT
DERMABOND ADVANCED (GAUZE/BANDAGES/DRESSINGS) ×2
DERMABOND ADVANCED .7 DNX12 (GAUZE/BANDAGES/DRESSINGS) IMPLANT
DRSG OPSITE POSTOP 3X4 (GAUZE/BANDAGES/DRESSINGS) ×2 IMPLANT
DURAPREP 26ML APPLICATOR (WOUND CARE) ×3 IMPLANT
GLOVE BIO SURGEON STRL SZ7 (GLOVE) ×3 IMPLANT
GLOVE BIOGEL PI IND STRL 7.0 (GLOVE) ×1 IMPLANT
GLOVE BIOGEL PI INDICATOR 7.0 (GLOVE) ×2
LIGASURE VESSEL 5MM BLUNT TIP (ELECTROSURGICAL) ×1 IMPLANT
NEEDLE HYPO 22GX1.5 SAFETY (NEEDLE) IMPLANT
NS IRRIG 1000ML POUR BTL (IV SOLUTION) ×3 IMPLANT
PACK LAPAROSCOPY BASIN (CUSTOM PROCEDURE TRAY) ×3 IMPLANT
PACK TRENDGUARD 450 HYBRID PRO (MISCELLANEOUS) IMPLANT
POUCH SPECIMEN RETRIEVAL 10MM (ENDOMECHANICALS) ×2 IMPLANT
PROTECTOR NERVE ULNAR (MISCELLANEOUS) ×6 IMPLANT
SLEEVE XCEL OPT CAN 5 100 (ENDOMECHANICALS) ×3 IMPLANT
SUT VIC AB 3-0 PS2 18 (SUTURE) ×3
SUT VIC AB 3-0 PS2 18XBRD (SUTURE) ×1 IMPLANT
SUT VICRYL 0 UR6 27IN ABS (SUTURE) ×6 IMPLANT
SYR CONTROL 10ML LL (SYRINGE) ×2 IMPLANT
TOWEL OR 17X24 6PK STRL BLUE (TOWEL DISPOSABLE) ×6 IMPLANT
TRENDGUARD 450 HYBRID PRO PACK (MISCELLANEOUS) ×3
TROCAR BALLN 12MMX100 BLUNT (TROCAR) ×3 IMPLANT
TROCAR OPTI TIP 5M 100M (ENDOMECHANICALS) ×3 IMPLANT
WARMER LAPAROSCOPE (MISCELLANEOUS) ×3 IMPLANT

## 2016-06-12 NOTE — Progress Notes (Unsigned)
  CC: Desired Permanent sterilization HPI: 31 yo G3P3003 s/p SVD 03/27/2016. Her pregnancies have been complicated by gestational hypertension. She desires permanent sterilization. R/B/A d/w patient and she wishes to proceed with laparoscopic bilateral salpingectomy for permanent sterilization and future risk reduction. O: AOx3, NAD Normal work of breathing Abd soft/NT  A/P 1) Proceed with L/S bilateral salpingectomy 2) SCDs

## 2016-06-12 NOTE — Op Note (Signed)
Pre-Operative Diagnosis: 1) Desired Permanent Sterilization Postoperative Diagnosis: 1) Same Procedure: Bilateral salpingectomy Surgeon: Dr. Waynard Reeds Assistant: Dr. Essie Hart Anesthesia: General and 0.25% Marcaine injected locally Operative Findings: Normal fallopian tubes bilaterally. Normal ovaries, tubes, uterus, bowels, liver noted. Specimen: bilateral fallopian tubes EBL: Minimal  Kristin Sellers is a 31 yo G3P3003 who desires permanent sterilization.  Risks, benefits, and alternatives of the procedure were reviewed with the patient and she wished to proceed with bilateral salpingectomy for permanent sterilization and future risk reduction. Following the appropriate informed consent the patient was taken to the OR where adequate surgical anesthesia was confirmed.  After a time-out procedure was performed that appropriately identified the patient the vagina and abdomen were prepped and draped in the normal sterile fashion. A speculum was placed in the vagina and a hulka uterine manipulator was placed into the cervix. Attention was then turned to the abdome.  A semilunar infraumbilical skin incision was made with the scalpel and the underlying subcutaneous tissue was dissected bluntly to the level of the fascia. The fascia was grasped with coker clamps x 2, tented up, and incised with mayo scissors. Intra-abdominal entry was confirmed by direct visualization of intraabdominal contents. The hasson port was placed and the abdomen was insufflated. A Left lower quadrant 5mm port was placed under direct visualization. The same procedure was repeated on the right lower quadrant. The Right fallopian tube was grasped and elevated and the ligasure was used to take successive bites down the mesosalpinx to the level of the uterine fundus. The tube was transected at this level. The same procedure was repeated on the contralateral side. Both surgical sites were noted to be hemostatic. The tubes were removed through the  ports, the ports were removed under direct visualization. The umbilical port was removed and the abdomen was desufflated. The umbilical port was removed and the fascia was closed with vicryl. THe right and left lower quadrant port sites were closed with dermabond. The hulka uterine manipulator was removed. All sponge, lap, and needle counts were correct. The patient was transferred to the PACU in stable condition.

## 2016-06-12 NOTE — Anesthesia Procedure Notes (Signed)
Procedure Name: Intubation Date/Time: 06/12/2016 12:15 PM Performed by: Hewitt Blade Pre-anesthesia Checklist: Patient identified, Emergency Drugs available, Suction available and Patient being monitored Patient Re-evaluated:Patient Re-evaluated prior to inductionOxygen Delivery Method: Circle system utilized Preoxygenation: Pre-oxygenation with 100% oxygen Intubation Type: IV induction Ventilation: Mask ventilation without difficulty Laryngoscope Size: Mac and 3 Grade View: Grade I Tube type: Oral Tube size: 7.0 mm Airway Equipment and Method: Stylet Placement Confirmation: ETT inserted through vocal cords under direct vision,  positive ETCO2 and breath sounds checked- equal and bilateral Secured at: 21 cm Tube secured with: Tape Dental Injury: Teeth and Oropharynx as per pre-operative assessment

## 2016-06-12 NOTE — Anesthesia Preprocedure Evaluation (Signed)
Anesthesia Evaluation    Airway Mallampati: II  TM Distance: >3 FB Neck ROM: Full    Dental  (+) Teeth Intact, Dental Advisory Given   Pulmonary    Pulmonary exam normal breath sounds clear to auscultation       Cardiovascular hypertension, Normal cardiovascular exam Rhythm:Regular Rate:Normal     Neuro/Psych    GI/Hepatic   Endo/Other    Renal/GU      Musculoskeletal   Abdominal   Peds  Hematology   Anesthesia Other Findings   Reproductive/Obstetrics                             Anesthesia Physical Anesthesia Plan  ASA: II  Anesthesia Plan: General   Post-op Pain Management:    Induction: Intravenous  Airway Management Planned: Oral ETT  Additional Equipment:   Intra-op Plan:   Post-operative Plan: Extubation in OR  Informed Consent: I have reviewed the patients History and Physical, chart, labs and discussed the procedure including the risks, benefits and alternatives for the proposed anesthesia with the patient or authorized representative who has indicated his/her understanding and acceptance.   Dental advisory given  Plan Discussed with: CRNA  Anesthesia Plan Comments: (Risks/benefits of general anesthesia discussed with patient including risk of damage to teeth, lips, gum, and tongue, nausea/vomiting, allergic reactions to medications, and the possibility of heart attack, stroke and death.  All patient questions answered.  Patient wishes to proceed.)        Anesthesia Quick Evaluation

## 2016-06-12 NOTE — Transfer of Care (Signed)
Immediate Anesthesia Transfer of Care Note  Patient: Kristin Sellers  Procedure(s) Performed: Procedure(s): LAPAROSCOPIC BILATERAL SALPINGECTOMY (Bilateral)  Patient Location: PACU  Anesthesia Type:General  Level of Consciousness: awake, alert  and oriented  Airway & Oxygen Therapy: Patient Spontanous Breathing and Patient connected to nasal cannula oxygen  Post-op Assessment: Report given to RN, Post -op Vital signs reviewed and stable and Patient moving all extremities  Post vital signs: Reviewed and stable  Last Vitals:  Vitals:   06/12/16 1052  BP: 121/84  Pulse: 95  Resp: 16  Temp: 36.7 C    Last Pain:  Vitals:   06/12/16 1052  TempSrc: Oral      Patients Stated Pain Goal: 3 (06/12/16 1052)  Complications: No apparent anesthesia complications

## 2016-06-12 NOTE — Discharge Instructions (Signed)
DISCHARGE INSTRUCTIONS: Laparoscopy ° °The following instructions have been prepared to help you care for yourself upon your return home today. ° °Wound care: °• Do not get the incision wet for the first 24 hours. The incision should be kept clean and dry. °• The Band-Aids or dressings may be removed the day after surgery. °• Should the incision become sore, red, and swollen after the first week, check with your doctor. ° °Personal hygiene: °• Shower the day after your procedure. ° °Activity and limitations: °• Do NOT drive or operate any equipment today. °• Do NOT lift anything more than 15 pounds for 2-3 weeks after surgery. °• Do NOT rest in bed all day. °• Walking is encouraged. Walk each day, starting slowly with 5-minute walks 3 or 4 times a day. Slowly increase the length of your walks. °• Walk up and down stairs slowly. °• Do NOT do strenuous activities, such as golfing, playing tennis, bowling, running, biking, weight lifting, gardening, mowing, or vacuuming for 2-4 weeks. Ask your doctor when it is okay to start. ° °Diet: Eat a light meal as desired this evening. You may resume your usual diet tomorrow. ° °Return to work: This is dependent on the type of work you do. For the most part you can return to a desk job within a week of surgery. If you are more active at work, please discuss this with your doctor. ° °What to expect after your surgery: You may have a slight burning sensation when you urinate on the first day. You may have a very small amount of blood in the urine. Expect to have a small amount of vaginal discharge/light bleeding for 1-2 weeks. It is not unusual to have abdominal soreness and bruising for up to 2 weeks. You may be tired and need more rest for about 1 week. You may experience shoulder pain for 24-72 hours. Lying flat in bed may relieve it. ° °Call your doctor for any of the following: °• Develop a fever of 100.4 or greater °• Inability to urinate 6 hours after discharge from  hospital °• Severe pain not relieved by pain medications °• Persistent of heavy bleeding at incision site °• Redness or swelling around incision site after a week °• Increasing nausea or vomiting ° °Patient Signature________________________________________ °Nurse Signature_________________________________________ °Post Anesthesia Home Care Instructions ° °Activity: °Get plenty of rest for the remainder of the day. A responsible individual must stay with you for 24 hours following the procedure.  °For the next 24 hours, DO NOT: °-Drive a car °-Operate machinery °-Drink alcoholic beverages °-Take any medication unless instructed by your physician °-Make any legal decisions or sign important papers. ° °Meals: °Start with liquid foods such as gelatin or soup. Progress to regular foods as tolerated. Avoid greasy, spicy, heavy foods. If nausea and/or vomiting occur, drink only clear liquids until the nausea and/or vomiting subsides. Call your physician if vomiting continues. ° °Special Instructions/Symptoms: °Your throat may feel dry or sore from the anesthesia or the breathing tube placed in your throat during surgery. If this causes discomfort, gargle with warm salt water. The discomfort should disappear within 24 hours. ° °If you had a scopolamine patch placed behind your ear for the management of post- operative nausea and/or vomiting: ° °1. The medication in the patch is effective for 72 hours, after which it should be removed.  Wrap patch in a tissue and discard in the trash. Wash hands thoroughly with soap and water. °2. You may remove the patch   earlier than 72 hours if you experience unpleasant side effects which may include dry mouth, dizziness or visual disturbances. °3. Avoid touching the patch. Wash your hands with soap and water after contact with the patch. °   ° °

## 2016-06-12 NOTE — Anesthesia Preprocedure Evaluation (Signed)
Anesthesia Evaluation  Patient identified by MRN, date of birth, ID band Patient awake    Reviewed: Allergy & Precautions, NPO status , Patient's Chart, lab work & pertinent test results  Airway Mallampati: II  TM Distance: >3 FB Neck ROM: Full    Dental  (+) Teeth Intact, Dental Advisory Given   Pulmonary asthma ,    Pulmonary exam normal breath sounds clear to auscultation       Cardiovascular hypertension, Normal cardiovascular exam Rhythm:Regular Rate:Normal     Neuro/Psych PSYCHIATRIC DISORDERS Anxiety  Neuromuscular disease    GI/Hepatic negative GI ROS, Neg liver ROS,   Endo/Other  negative endocrine ROSObesity   Renal/GU negative Renal ROS     Musculoskeletal negative musculoskeletal ROS (+)   Abdominal   Peds  Hematology negative hematology ROS (+)   Anesthesia Other Findings Day of surgery medications reviewed with the patient.  Reproductive/Obstetrics                            Anesthesia Physical Anesthesia Plan  ASA: II  Anesthesia Plan: General   Post-op Pain Management:    Induction: Intravenous  Airway Management Planned: Oral ETT  Additional Equipment:   Intra-op Plan:   Post-operative Plan: Extubation in OR  Informed Consent: I have reviewed the patients History and Physical, chart, labs and discussed the procedure including the risks, benefits and alternatives for the proposed anesthesia with the patient or authorized representative who has indicated his/her understanding and acceptance.   Dental advisory given  Plan Discussed with: CRNA  Anesthesia Plan Comments: (Risks/benefits of general anesthesia discussed with patient including risk of damage to teeth, lips, gum, and tongue, nausea/vomiting, allergic reactions to medications, and the possibility of heart attack, stroke and death.  All patient questions answered.  Patient wishes to proceed.)         Anesthesia Quick Evaluation

## 2016-06-12 NOTE — H&P (Signed)
CC: Desired Permanent sterilization HPI: 31 yo G3P3003 s/p SVD 03/27/2016. Her pregnancies have been complicated by gestational hypertension. She desires permanent sterilization. R/B/A d/w patient and she wishes to proceed with laparoscopic bilateral salpingectomy for permanent sterilization and future risk reduction. O: AOx3, NAD Normal work of breathing Abd soft/NT  A/P 1) Proceed with L/S bilateral salpingectomy 2) SCDs

## 2016-06-13 ENCOUNTER — Encounter (HOSPITAL_COMMUNITY): Payer: Self-pay | Admitting: Obstetrics and Gynecology

## 2016-06-13 NOTE — Anesthesia Postprocedure Evaluation (Signed)
Anesthesia Post Note  Patient: Sharelle Burditt  Procedure(s) Performed: Procedure(s) (LRB): LAPAROSCOPIC BILATERAL SALPINGECTOMY (Bilateral)  Patient location during evaluation: PACU Anesthesia Type: General Level of consciousness: awake and alert Pain management: pain level controlled Vital Signs Assessment: post-procedure vital signs reviewed and stable Respiratory status: spontaneous breathing, nonlabored ventilation, respiratory function stable and patient connected to nasal cannula oxygen Cardiovascular status: blood pressure returned to baseline and stable Postop Assessment: no signs of nausea or vomiting Anesthetic complications: no       Last Vitals:  Vitals:   06/12/16 1415 06/12/16 1453  BP: 113/69 113/69  Pulse: 69 69  Resp: 12 14  Temp: 37.1 C 37.1 C    Last Pain:  Vitals:   06/12/16 1453  TempSrc:   PainSc: 2                  Cecile Hearing

## 2017-04-02 DIAGNOSIS — H52223 Regular astigmatism, bilateral: Secondary | ICD-10-CM | POA: Diagnosis not present

## 2017-04-02 DIAGNOSIS — H5203 Hypermetropia, bilateral: Secondary | ICD-10-CM | POA: Diagnosis not present

## 2017-05-26 ENCOUNTER — Encounter: Payer: Self-pay | Admitting: Nurse Practitioner

## 2017-05-26 ENCOUNTER — Ambulatory Visit: Payer: Self-pay | Admitting: Nurse Practitioner

## 2017-05-26 VITALS — BP 120/80 | Temp 98.6°F | Wt 159.0 lb

## 2017-05-26 DIAGNOSIS — J0100 Acute maxillary sinusitis, unspecified: Secondary | ICD-10-CM

## 2017-05-26 MED ORDER — AMOXICILLIN-POT CLAVULANATE 875-125 MG PO TABS: 1.0000 | ORAL_TABLET | Freq: Two times a day (BID) | ORAL | 0 refills | Status: AC

## 2017-05-26 MED ORDER — MONTELUKAST SODIUM 10 MG PO TABS: 10.0000 mg | ORAL_TABLET | Freq: Every day | ORAL | 0 refills | Status: DC

## 2017-05-26 MED ORDER — FLUTICASONE PROPIONATE 50 MCG/ACT NA SUSP: 2.0000 | Freq: Every day | NASAL | 0 refills | Status: DC

## 2017-05-26 NOTE — Progress Notes (Signed)
Subjective:  Kristin Sellers is a 32 y.o. female who presents for evaluation of possible sinusitis.  Symptoms include cough described as productive of green sputum, fever: no fever, headache described as dull, nasal congestion, post nasal drip, productive cough with green colored sputum, sinus pressure, sinus pain, sneezing and sore throat.  Onset of symptoms was 10 days ago, and has been gradually worsening since that time.  Treatment to date:  antihistamines and nasal steroids.  High risk factors for influenza complications:  none.  The following portions of the patient's history were reviewed and updated as appropriate:  allergies, current medications and past medical history.  Constitutional: positive for fatigue, negative for anorexia, fatigue, malaise and sweats Eyes: negative Ears, nose, mouth, throat, and face: positive for nasal congestion and sore throat, negative for ear drainage and earaches Respiratory: positive for cough and sputum, negative for dyspnea on exertion, pneumonia, stridor and wheezing Cardiovascular: negative Gastrointestinal: negative Neurological: positive for headaches, negative for coordination problems, dizziness, paresthesia, speech problems, vertigo and weakness Allergic/Immunologic: positive for hay fever, negative for anaphylaxis and urticaria Objective:  BP 120/80   Temp 98.6 F (37 C)   Wt 159 lb (72.1 kg)   SpO2 98%   BMI 29.08 kg/m  General appearance: alert, cooperative, fatigued and no distress Head: Normocephalic, without obvious abnormality, atraumatic Eyes: conjunctivae/corneas clear. PERRL, EOM's intact. Fundi benign. Ears: normal TM's and external ear canals both ears Nose: no discharge, turbinates swollen, inflamed, moderate maxillary sinus tenderness bilateral, no frontal sinus tenderness bilateral Throat: lips, mucosa, and tongue normal; teeth and gums normal Lungs: clear to auscultation bilaterally Heart: regular rate and rhythm, S1, S2  normal, no murmur, click, rub or gallop Pulses: 2+ and symmetric Skin: Skin color, texture, turgor normal. No rashes or lesions Lymph nodes: cervicala and submandibular nodes normal Neurologic: Grossly normal    Assessment:  Acute Maxillary Sinusitis    Plan:  Discussed diagnosis and treatment of sinusitis. Educational material distributed and questions answered. Suggested symptomatic OTC remedies. Supportive care with appropriate antipyretics and fluids. Nasal saline spray for congestion. Augmentin per orders. Nasal steroids per orders. Follow up as needed. Patient instructed to use humidifier at home.  Patient verbalizes understanding and had no questions at time of discharge.  Meds ordered this encounter  Medications  . amoxicillin-clavulanate (AUGMENTIN) 875-125 MG tablet    Sig: Take 1 tablet by mouth 2 (two) times daily for 10 days.    Dispense:  20 tablet    Refill:  0    Order Specific Question:   Supervising Provider    Answer:   Stacie GlazeJENKINS, JOHN E [[id number]]  . montelukast (SINGULAIR) 10 MG tablet    Sig: Take 1 tablet (10 mg total) by mouth at bedtime.    Dispense:  30 tablet    Refill:  0    Order Specific Question:   Supervising Provider    Answer:   Stacie GlazeJENKINS, JOHN E [[id number]]  . fluticasone (FLONASE) 50 MCG/ACT nasal spray    Sig: Place 2 sprays into both nostrils daily for 10 days.    Dispense:  16 g    Refill:  0    Order Specific Question:   Supervising Provider    Answer:   Stacie GlazeJENKINS, JOHN E [phone][[id number]]

## 2017-05-26 NOTE — Patient Instructions (Signed)

## 2017-05-28 ENCOUNTER — Telehealth: Payer: Self-pay | Admitting: Emergency Medicine

## 2017-05-28 NOTE — Telephone Encounter (Signed)
Spoke to patient informed me [facility] is doing much better [facility]

## 2017-11-11 DIAGNOSIS — Z01419 Encounter for gynecological examination (general) (routine) without abnormal findings: Secondary | ICD-10-CM | POA: Diagnosis not present

## 2017-11-11 DIAGNOSIS — Z124 Encounter for screening for malignant neoplasm of cervix: Secondary | ICD-10-CM | POA: Diagnosis not present

## 2017-11-30 ENCOUNTER — Ambulatory Visit: Payer: Self-pay | Admitting: Nurse Practitioner

## 2017-11-30 VITALS — BP 110/70 | HR 89 | Temp 99.6°F | Ht 62.0 in | Wt 165.8 lb

## 2017-11-30 DIAGNOSIS — J069 Acute upper respiratory infection, unspecified: Secondary | ICD-10-CM

## 2017-11-30 DIAGNOSIS — R5081 Fever presenting with conditions classified elsewhere: Secondary | ICD-10-CM

## 2017-11-30 LAB — POCT INFLUENZA A/B
INFLUENZA A, POC: NEGATIVE
INFLUENZA B, POC: NEGATIVE

## 2017-11-30 MED ORDER — OXYMETAZOLINE HCL 0.05 % NA SOLN: 1.0000 | Freq: Two times a day (BID) | NASAL | 0 refills | Status: AC

## 2017-11-30 MED ORDER — PSEUDOEPH-BROMPHEN-DM 30-2-10 MG/5ML PO SYRP: 5.0000 mL | ORAL_SOLUTION | Freq: Four times a day (QID) | ORAL | 0 refills | Status: AC | PRN

## 2017-11-30 NOTE — Patient Instructions (Signed)
Viral Respiratory Infection -Take medications as prescribed. -Increase fluids.  -Rest as much as possible. -Ibuprofen 800mg  every 8 hours as needed for fever, pain or general discomfort. -Use humidifier or vaporizer when at home and during sleep. -Sleep elevated on at least 2 pillows at bedtime. -May use a teaspoon of honey as needed for cough. -Cough drops as needed. -Follow up in 48 to 72 hours if symptoms do not improve.  A respiratory infection is an illness that affects part of the respiratory system, such as the lungs, nose, or throat. Most respiratory infections are caused by either viruses or bacteria. A respiratory infection that is caused by a virus is called a viral respiratory infection. Common types of viral respiratory infections include:  A cold.  The flu (influenza).  A respiratory syncytial virus (RSV) infection.  How do I know if I have a viral respiratory infection? Most viral respiratory infections cause:  A stuffy or runny nose.  Yellow or green nasal discharge.  A cough.  Sneezing.  Fatigue.  Achy muscles.  A sore throat.  Sweating or chills.  A fever.  A headache.  How are viral respiratory infections treated? If influenza is diagnosed early, it may be treated with an antiviral medicine that shortens the length of time a person has symptoms. Symptoms of viral respiratory infections may be treated with over-the-counter and prescription medicines, such as:  Expectorants. These make it easier to cough up mucus.  Decongestant nasal sprays.  Health care providers do not prescribe antibiotic medicines for viral infections. This is because antibiotics are designed to kill bacteria. They have no effect on viruses. How do I know if I should stay home from work or school? To avoid exposing others to your respiratory infection, stay home if you have:  A fever.  A persistent cough.  A sore throat.  A runny nose.  Sneezing.  Muscles  aches.  Headaches.  Fatigue.  Weakness.  Chills.  Sweating.  Nausea.  Follow these instructions at home:  Rest as much as possible.  Take over-the-counter and prescription medicines only as told by your health care provider.  Drink enough fluid to keep your urine clear or pale yellow. This helps prevent dehydration and helps loosen up mucus.  Gargle with a salt-water mixture 3-4 times per day or as needed. To make a salt-water mixture, completely dissolve -1 tsp of salt in 1 cup of warm water.  Use nose drops made from salt water to ease congestion and soften raw skin around your nose.  Do not drink alcohol.  Do not use tobacco products, including cigarettes, chewing tobacco, and e-cigarettes. If you need help quitting, ask your health care provider. Contact a health care provider if:  Your symptoms last for 10 days or longer.  Your symptoms get worse over time.  You have a fever.  You have severe sinus pain in your face or forehead.  The glands in your jaw or neck become very swollen. Get help right away if:  You feel pain or pressure in your chest.  You have shortness of breath.  You faint or feel like you will faint.  You have severe and persistent vomiting.  You feel confused or disoriented. This information is not intended to replace advice given to you by your health care provider. Make sure you discuss any questions you have with your health care provider. Document Released: 11/13/2004 Document Revised: 07/12/2015 Document Reviewed: 07/12/2014 Elsevier Interactive Patient Education  [employer].

## 2017-11-30 NOTE — Progress Notes (Signed)
Subjective:  Kristin Sellers is a 32 y.o. female who presents for evaluation of influenza like symptoms.  Symptoms include achiness, fever: fevers up to 101.7 on tympanic thermometer degrees, headache described as dull, achy, nasal congestion, post nasal drip and productive cough with yellow colored sputum.  Onset of symptoms was 5 days ago, and has been gradually worsening since that time.  Treatment to date:  ibuprofen, Tylenol Cough and Cold, use albuterol inhaler today.  High risk factors for influenza complications:  none.  The following portions of the patient's history were reviewed and updated as appropriate:  allergies, current medications and past medical history.  Constitutional: positive for anorexia, chills, fatigue, fevers and malaise, negative for night sweats and sweats Eyes: negative Ears, nose, mouth, throat, and face: positive for nasal congestion and sore throat, negative for ear drainage and earaches Respiratory: positive for cough, sputum and wheezing, negative for asthma, chronic bronchitis, dyspnea on exertion and emphysema Cardiovascular: negative Gastrointestinal: positive for decreased appetite and diarrhea, negative for abdominal pain, nausea and vomiting Neurological: positive for headaches, negative for coordination problems, dizziness, paresthesia, speech problems, tremors, vertigo and weakness Allergic/Immunologic: negative   Objective:  BP 110/70 (BP Location: Right Arm, Patient Position: Sitting)   Pulse 89   Temp 99.6 F (37.6 C) (Oral)   Ht 5\' 2"  (1.575 m)   Wt 165 lb 12.8 oz (75.2 kg)   SpO2 98%   BMI 30.33 kg/m  General appearance: alert, cooperative, fatigued and no distress Head: Normocephalic, without obvious abnormality, atraumatic Eyes: conjunctivae/corneas clear. PERRL, EOM's intact. Fundi benign. Ears: normal TM's and external ear canals both ears Nose: no discharge, moderate congestion, turbinates swollen, inflamed, no sinus  tenderness Throat: lips, mucosa, and tongue normal; teeth and gums normal Lungs: clear to auscultation bilaterally Heart: regular rate and rhythm, S1, S2 normal, no murmur, click, rub or gallop Abdomen: soft, non-tender; bowel sounds normal; no masses,  no organomegaly Pulses: 2+ and symmetric Skin: Skin color, texture, turgor normal. No rashes or lesions Lymph nodes: cervical and submandibular nodes normal Neurologic: Grossly normal    Assessment:  Viral Upper Respiratory Infection    Plan:  Exam findings, diagnosis etiology and medication use and indications reviewed with patient. Follow- Up and discharge instructions provided. No emergent/urgent issues found on exam. Patient education was provided. Patient verbalized understanding of information provided and agrees with plan of care (POC), all questions answered. The patient is advised to call or return to clinic if condition does not see an improvement in symptoms, or to seek the care of the closest emergency department if condition worsens with the above plan.   1. Fever in other diseases  - POCT Influenza A/B- negative  2. Viral upper respiratory infection  - brompheniramine-pseudoephedrine-DM 30-2-10 MG/5ML syrup; Take 5 mLs by mouth 4 (four) times daily as needed for up to 7 days.  Dispense: 150 mL; Refill: 0 - oxymetazoline (AFRIN NASAL SPRAY) 0.05 % nasal spray; Place 1 spray into both nostrils 2 (two) times daily for 5 days.  Dispense: 10 mL; Refill: 0 -Take medications as prescribed. -Increase fluids.  -Rest as much as possible. -Ibuprofen 800mg  every 8 hours as needed for fever, pain or general discomfort. -Use humidifier or vaporizer when at home and during sleep. -Sleep elevated on at least 2 pillows at bedtime. -May use a teaspoon of honey as needed for cough. -Cough drops as needed. -Follow up in 48 to 72 hours if symptoms do not improve.

## 2017-12-02 ENCOUNTER — Telehealth: Payer: Self-pay

## 2017-12-02 NOTE — Telephone Encounter (Signed)
Patient did not answered the phone, I left a message asking to call us back.  

## 2018-04-12 DIAGNOSIS — H5201 Hypermetropia, right eye: Secondary | ICD-10-CM | POA: Diagnosis not present

## 2018-04-12 DIAGNOSIS — H52223 Regular astigmatism, bilateral: Secondary | ICD-10-CM | POA: Diagnosis not present

## 2018-05-11 ENCOUNTER — Ambulatory Visit: Payer: Self-pay | Admitting: Physician Assistant

## 2018-05-11 VITALS — BP 122/72 | HR 87 | Temp 98.6°F | Resp 14 | Wt 172.0 lb

## 2018-05-11 DIAGNOSIS — J309 Allergic rhinitis, unspecified: Secondary | ICD-10-CM

## 2018-05-11 DIAGNOSIS — J029 Acute pharyngitis, unspecified: Secondary | ICD-10-CM

## 2018-05-11 DIAGNOSIS — H6123 Impacted cerumen, bilateral: Secondary | ICD-10-CM

## 2018-05-11 LAB — POCT RAPID STREP A (OFFICE): Rapid Strep A Screen: NEGATIVE

## 2018-05-11 MED ORDER — FLUTICASONE PROPIONATE 50 MCG/ACT NA SUSP
2.0000 | Freq: Every day | NASAL | 0 refills | Status: DC
Start: 2018-05-11 — End: 2019-05-31

## 2018-05-11 NOTE — Progress Notes (Addendum)
MRN: [id number] DOB: November 12, 1985  Subjective:   Kristin Sellers is a 33 y.o. female presenting for chief complaint of Sore Throat (x2days ) .  Reports 2 day history of dry scratchy throat and postnasal drip. Has associated sneezing.  Has hx of seasonal allergies and worked out in the yard the day before her symptoms appeared. Started claritin, which has helped. Just wanted to get checked out because her son has been treated for strep a few times over the past month. She did feel hot yesterday. Has occasional tickle in her throat causing her to have a dry cough. Denies ear pain, sinus pain, inability to swallow, voice change, productive cough, wheezing, shortness of breath, chest tightness, chest pain and myalgia, nausea, vomiting, abdominal pain and diarrhea. Denies recent travel or close contact with covid-9 case. Has hx of asthma as a child. Denies PMH of HTN, DM, COPD, heart disease, renal disease, and autoimmune disease. Patient has had flu shot this season. LMP ~05/04/18. Denies smoking. Denies any other aggravating or relieving factors, no other questions or concerns.  Review of Systems  Constitutional: Negative for diaphoresis.  Eyes: Negative for blurred vision, double vision and photophobia.  Cardiovascular: Negative for palpitations and leg swelling.  Skin: Negative for rash.  Neurological: Positive for headaches (mild). Negative for dizziness, tingling, tremors, sensory change and speech change.    Kristin Sellers has a current medication list which includes the following prescription(s): fluticasone, ibuprofen, montelukast, oxycodone-acetaminophen, and proair hfa. Also is allergic to sulfa antibiotics.  Kristin Sellers  has a past medical history of Allergy, Asthma, Bronchitis (10/2015), Heart murmur, History of Bell's palsy, History of pre-eclampsia, Hyperlipidemia, Palpitations, Pregnancy induced hypertension, and SVD (spontaneous vaginal delivery). Also  has a past surgical history that includes Mouth  surgery; tubes in ears; and Laparoscopic bilateral salpingectomy (Bilateral, 06/12/2016).   Objective:   Vitals: BP 122/72   Pulse 87   Temp 98.6 F (37 C)   Resp 14   Wt 172 lb (78 kg)   SpO2 100%   BMI 31.46 kg/m   Physical Exam Vitals signs reviewed.  Constitutional:      General: She is not in acute distress.    Appearance: She is well-developed. She is not ill-appearing or toxic-appearing.  HENT:     Head: Normocephalic and atraumatic.     Right Ear: External ear normal.     Left Ear: External ear normal.     Ears:     Comments: Bilateral ear canals impacted with copious amount of dark brown dry cerumen, unable to visualize TMs.  Post bilateral ear lavage, ear canals are clear.  TMs are visualized and are normal in appearance.  No erythema, bulging, or perforation of bilateral TMs noted.     Nose: Mucosal edema present.     Right Turbinates: Enlarged.     Left Turbinates: Enlarged.     Right Sinus: No maxillary sinus tenderness or frontal sinus tenderness.     Left Sinus: No maxillary sinus tenderness or frontal sinus tenderness.     Mouth/Throat:     Lips: Pink.     Mouth: Mucous membranes are moist.     Pharynx: Uvula midline. Posterior oropharyngeal erythema (cobblestoning noted) present. No oropharyngeal exudate or uvula swelling.     Tonsils: No tonsillar exudate or tonsillar abscesses. 1+ on the right. 1+ on the left.  Eyes:     Extraocular Movements: Extraocular movements intact.     Conjunctiva/sclera: Conjunctivae normal.     Pupils: Pupils are equal,  round, and reactive to light.  Neck:     Musculoskeletal: Normal range of motion.     Trachea: Phonation normal.  Cardiovascular:     Rate and Rhythm: Normal rate and regular rhythm.     Heart sounds: Normal heart sounds.  Pulmonary:     Effort: Pulmonary effort is normal.     Breath sounds: Normal breath sounds. No decreased breath sounds, wheezing, rhonchi or rales.  Lymphadenopathy:     Head:      Right side of head: No submental, submandibular, tonsillar, preauricular, posterior auricular or occipital adenopathy.     Left side of head: No submental, submandibular, tonsillar, preauricular, posterior auricular or occipital adenopathy.     Cervical: No cervical adenopathy.     Upper Body:     Right upper body: No supraclavicular adenopathy.     Left upper body: No supraclavicular adenopathy.  Skin:    General: Skin is warm and dry.  Neurological:     Mental Status: She is alert.     Results for orders placed or performed in visit on 05/11/18 (from the past 24 hour(s))  POCT rapid strep A     Status: None   Collection Time: 05/11/18  8:51 AM  Result Value Ref Range   Rapid Strep A Screen Negative Negative    Assessment and Plan :  1. Allergic rhinitis, unspecified seasonality, unspecified trigger 2. Sore throat - POCT rapid strep A 3. Bilateral impacted cerumen - Ear Lavage Hx and PE findings consistent with allergic rhinitis and postnasal drip. VSS. POCT strep test negative- pt reassured. B/l cerumen impaction resolved. Rec continuing Claritin. Add on flonase and nasal saline. Avoid potential allergen exposure. Follow up with family doctor as needed.   Benjiman Core, Cordelia Poche  [facility] 05/11/2018 9:26 AM

## 2018-05-11 NOTE — Patient Instructions (Addendum)
Use flonase in the morning, nasal saline at night. Continue Claritin.   Sore Throat When you have a sore throat, your throat may feel:  Tender.  Burning.  Irritated.  Scratchy.  Painful when you swallow.  Painful when you talk. Many things can cause a sore throat, such as:  An infection.  Allergies.  Dry air.  Smoke or pollution.  Radiation treatment.  Gastroesophageal reflux disease (GERD).  A tumor. A sore throat can be the first sign of another sickness. It can happen with other problems, like:  Coughing.  Sneezing.  Fever.  Swelling in the neck. Most sore throats go away without treatment. Follow these instructions at home:      Take over-the-counter medicines only as told by your doctor. ? If your child has a sore throat, do not give your child aspirin.  Drink enough fluids to keep your pee (urine) pale yellow.  Rest when you feel you need to.  To help with pain: ? Sip warm liquids, such as broth, herbal tea, or warm water. ? Eat or drink cold or frozen liquids, such as frozen ice pops. ? Gargle with a salt-water mixture 3-4 times a day or as needed. To make a salt-water mixture, add -1 tsp (3-6 g) of salt to 1 cup (237 mL) of warm water. Mix it until you cannot see the salt anymore. ? Suck on hard candy or throat lozenges. ? Put a cool-mist humidifier in your bedroom at night. ? Sit in the bathroom with the door closed for 5-10 minutes while you run hot water in the shower.  Do not use any products that contain nicotine or tobacco, such as cigarettes, e-cigarettes, and chewing tobacco. If you need help quitting, ask your doctor.  Wash your hands well and often with soap and water. If soap and water are not available, use hand sanitizer. Contact a doctor if:  You have a fever for more than 2-3 days.  You keep having symptoms for more than 2-3 days.  Your throat does not get better in 7 days.  You have a fever and your symptoms suddenly get  worse.  Your child who is 3 months to 21 years old has a temperature of 102.47F (39C) or higher. Get help right away if:  You have trouble breathing.  You cannot swallow fluids, soft foods, or your saliva.  You have swelling in your throat or neck that gets worse.  You keep feeling sick to your stomach (nauseous).  You keep throwing up (vomiting). Summary  A sore throat is pain, burning, irritation, or scratchiness in the throat. Many things can cause a sore throat.  Take over-the-counter medicines only as told by your doctor. Do not give your child aspirin.  Drink plenty of fluids, and rest as needed.  Contact a doctor if your symptoms get worse or your sore throat does not get better within 7 days. This information is not intended to replace advice given to you by your health care provider. Make sure you discuss any questions you have with your health care provider. Document Released: 11/13/2007 Document Revised: 07/06/2017 Document Reviewed: 07/06/2017 Elsevier Interactive Patient Education  2019 [employer].

## 2018-05-13 ENCOUNTER — Telehealth: Payer: Self-pay

## 2018-05-13 NOTE — Telephone Encounter (Signed)
Patient did not answered I left a message asking to call us back.

## 2018-11-25 ENCOUNTER — Other Ambulatory Visit: Payer: Self-pay

## 2018-11-25 DIAGNOSIS — Z20822 Contact with and (suspected) exposure to covid-19: Secondary | ICD-10-CM

## 2018-11-25 DIAGNOSIS — Z20828 Contact with and (suspected) exposure to other viral communicable diseases: Secondary | ICD-10-CM | POA: Diagnosis not present

## 2018-11-26 LAB — NOVEL CORONAVIRUS, NAA: SARS-CoV-2, NAA: NOT DETECTED

## 2018-11-28 ENCOUNTER — Telehealth: Payer: 59 | Admitting: Family

## 2018-11-28 DIAGNOSIS — J069 Acute upper respiratory infection, unspecified: Secondary | ICD-10-CM | POA: Diagnosis not present

## 2018-11-28 MED ORDER — FLUTICASONE PROPIONATE 50 MCG/ACT NA SUSP: 2.0000 | Freq: Every day | NASAL | 6 refills | Status: DC

## 2018-11-28 MED ORDER — BENZONATATE 100 MG PO CAPS
100.0000 mg | ORAL_CAPSULE | Freq: Three times a day (TID) | ORAL | 0 refills | Status: DC | PRN
Start: 2018-11-28 — End: 2019-05-31

## 2018-11-28 NOTE — Progress Notes (Signed)

## 2018-12-17 IMAGING — CT CT ANGIO CHEST
1 of 2 series · 19 of 32 positions shown · IV contrast (OMNIPAQUE)
Comparison: Chest radiograph performed earlier today at [DATE] p.m.

CLINICAL DATA: Acute onset of shortness of breath and tachycardia.
Patient is 34 weeks pregnant. Initial encounter.

EXAM:
CT ANGIOGRAPHY CHEST WITH CONTRAST
TECHNIQUE: Multidetector CT imaging of the chest was performed using the
standard protocol during bolus administration of intravenous
contrast. Multiplanar CT image reconstructions and MIPs were
obtained to evaluate the vascular anatomy.
CONTRAST:  100 mL of Isovue 370 IV contrast

[Series 5: (person_name) thins · axial · 0.76mm/px · z∈[+778,+1024]mm · 19 of 391 slices shown]
[im 20/391  lung]
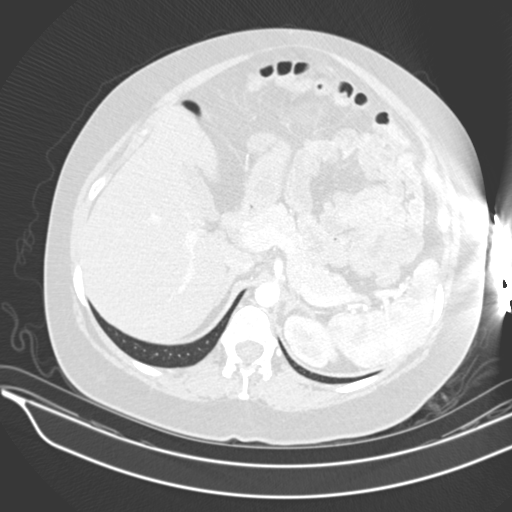
[im 40/391  mediastinal]
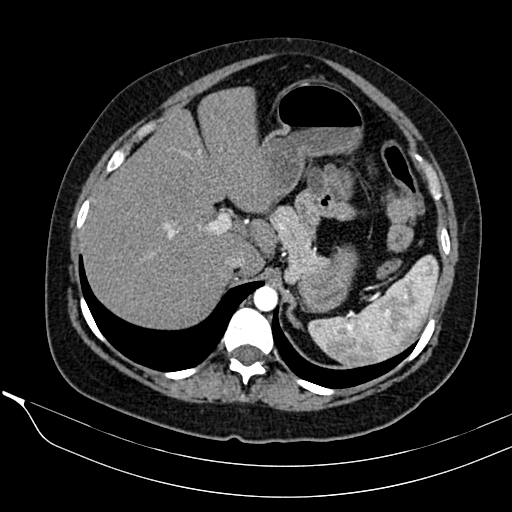
[im 59/391  lung]
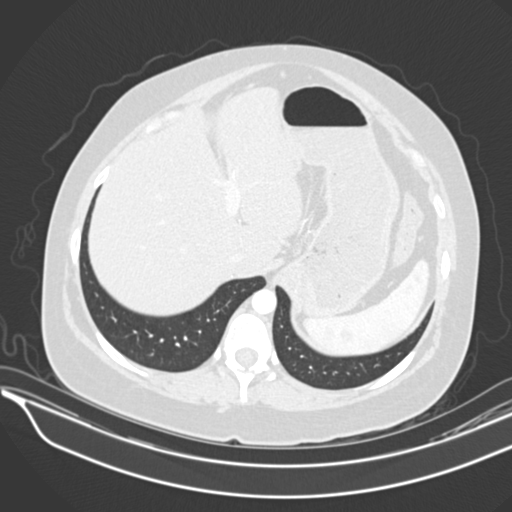
[im 98/391  mediastinal]
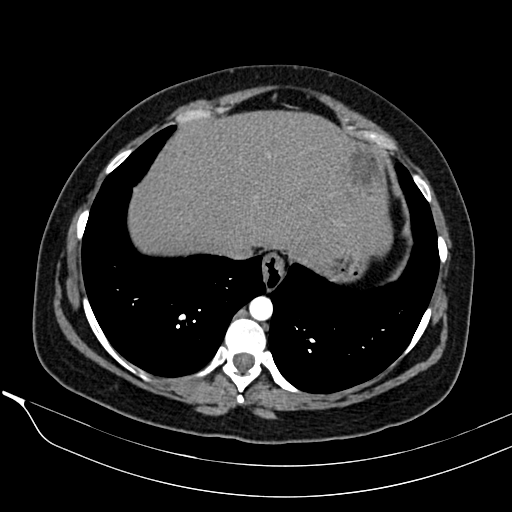
[im 118/391  lung]
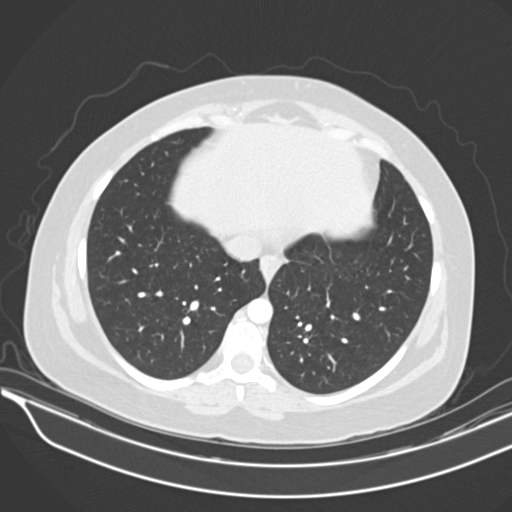
[im 131/391  mediastinal]
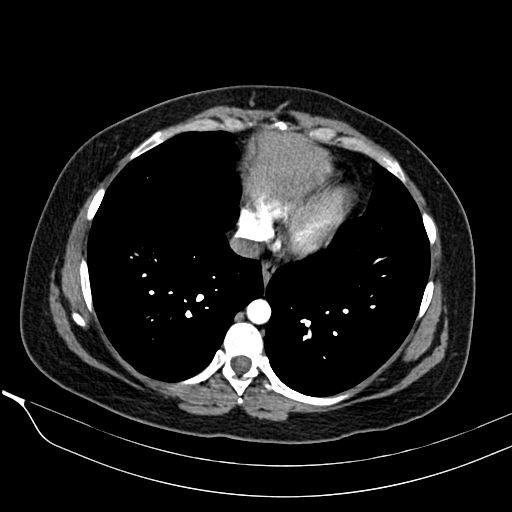
[im 137/391  lung]
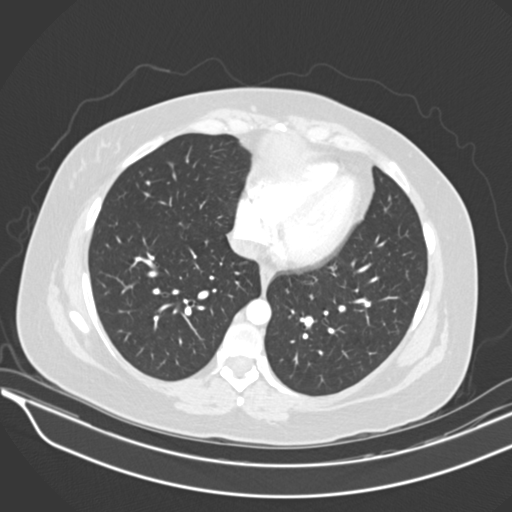
[im 157/391  mediastinal]
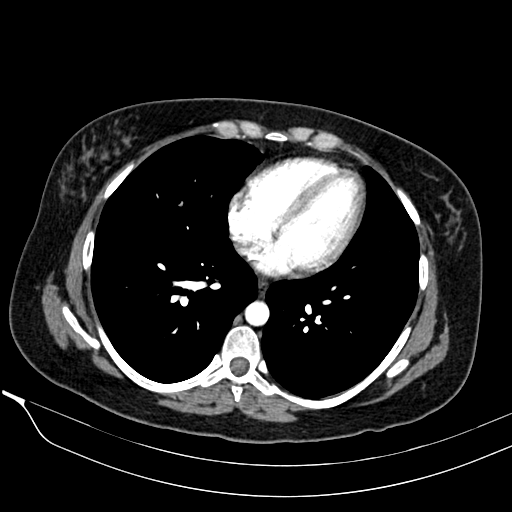
[im 176/391  lung]
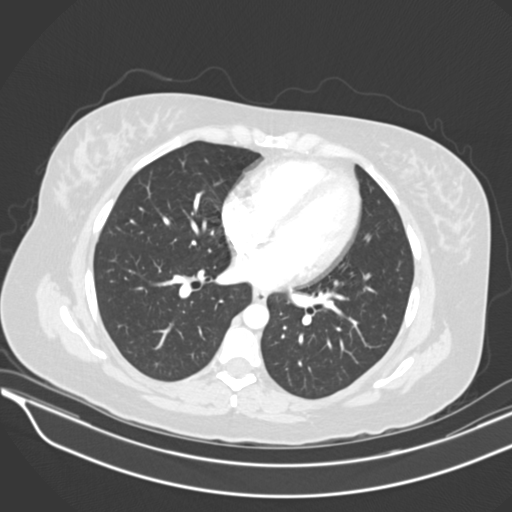
[im 196/391  mediastinal]
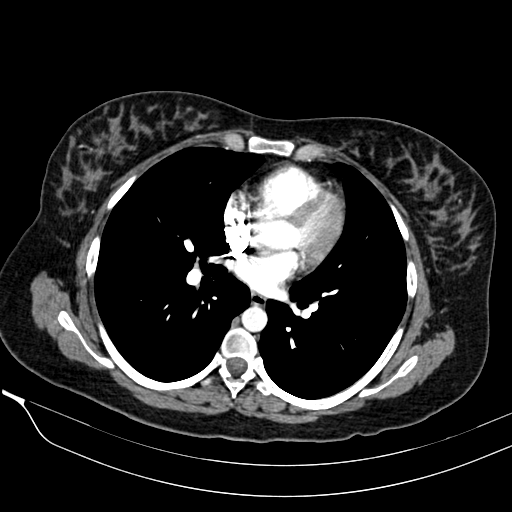
[im 215/391  lung]
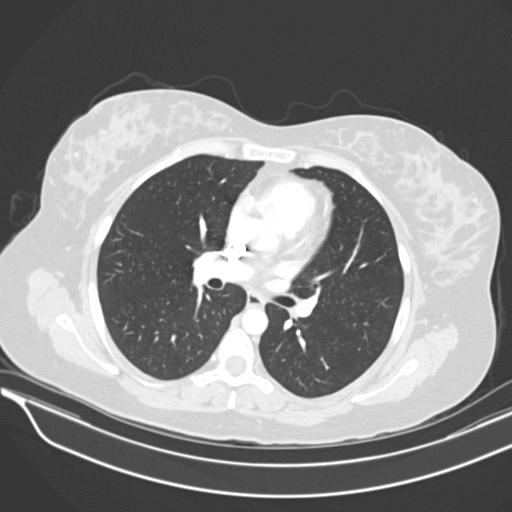
[im 235/391  mediastinal]
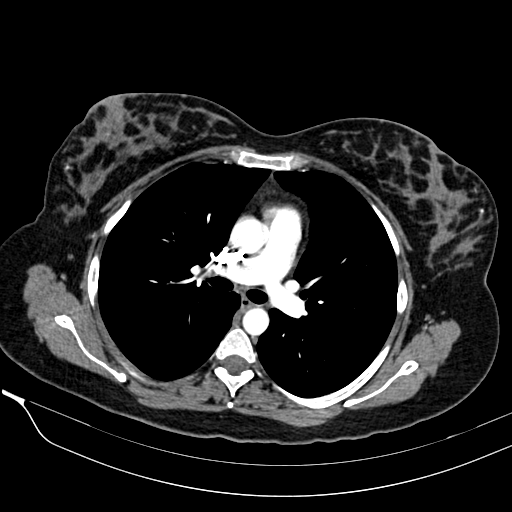
[im 254/391  lung]
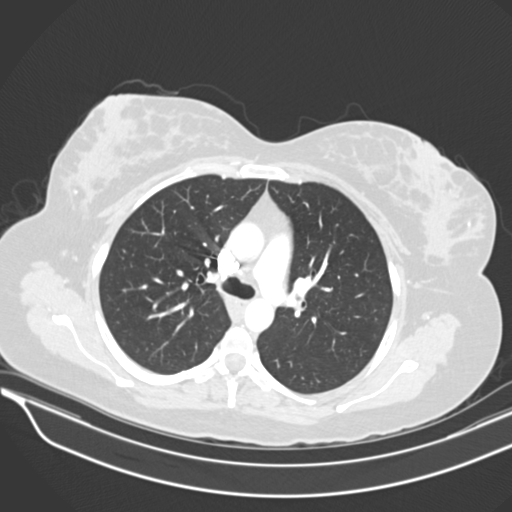
[im 261/391  mediastinal]
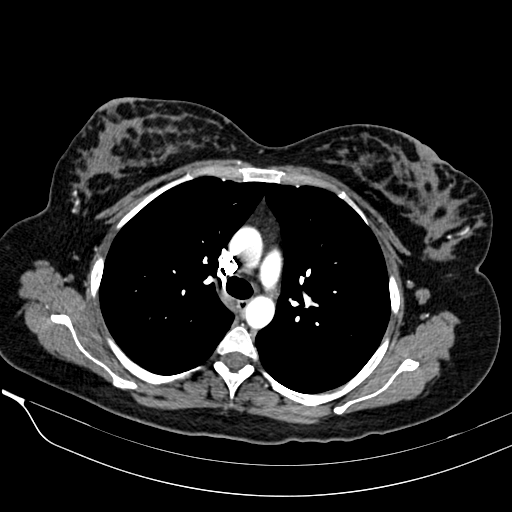
[im 274/391  lung]
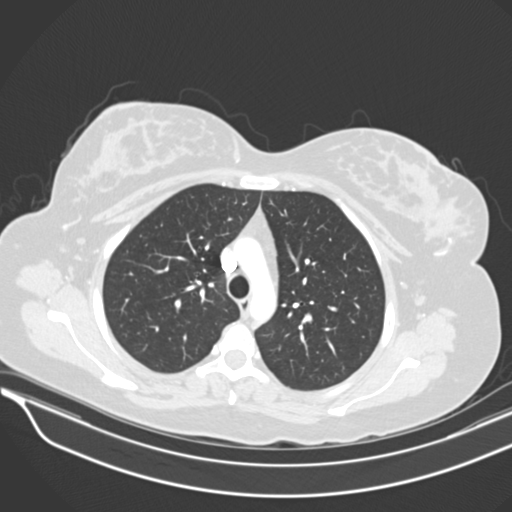
[im 293/391  mediastinal]
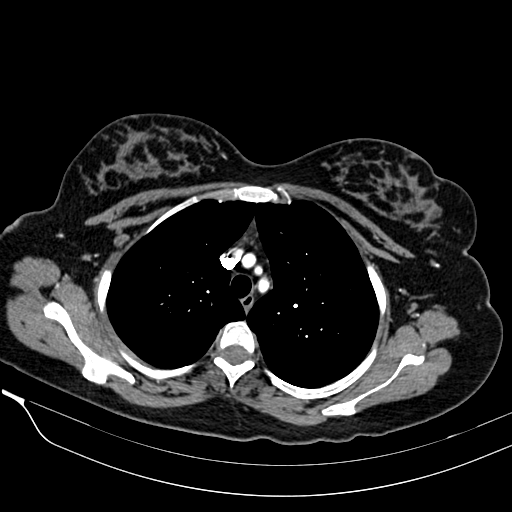
[im 332/391  lung]
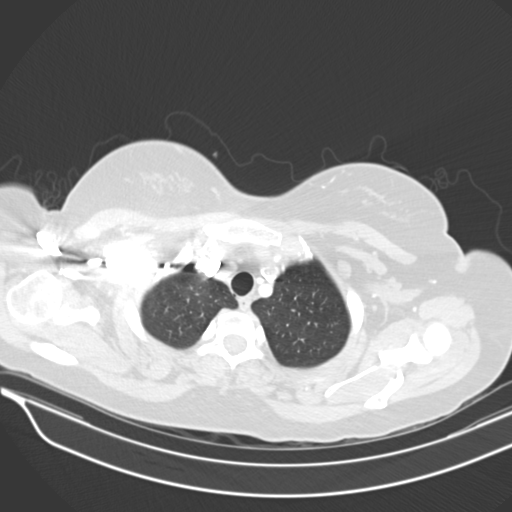
[im 352/391  mediastinal]
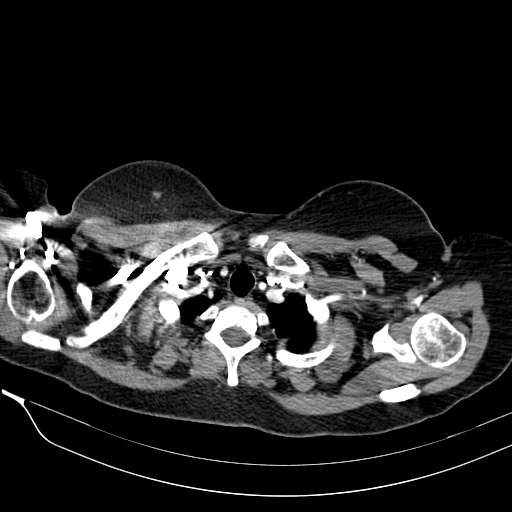
[im 371/391  lung]
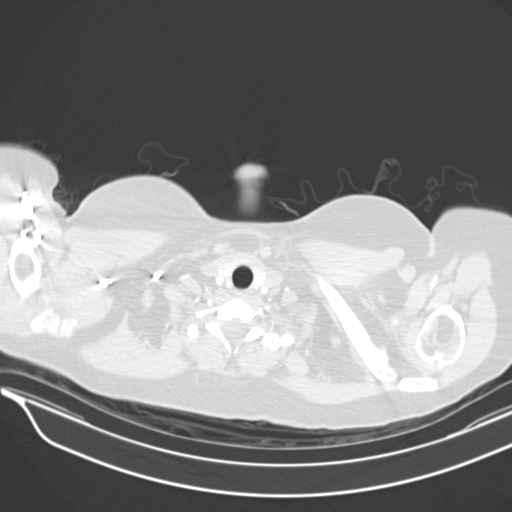

[19 of 32 positions shown; findings below may reference images not displayed]

FINDINGS: Cardiovascular:  There is no evidence of pulmonary embolus.

The heart is normal in size. The thoracic aorta is unremarkable in
appearance. The great vessels are unremarkable in appearance.

Mediastinum/Nodes: The mediastinum is unremarkable. No mediastinal
lymphadenopathy is seen. No pericardial effusion is identified. The
visualized portions of the thyroid gland are unremarkable. No
axillary lymphadenopathy is seen.

Lungs/Pleura: The lungs are clear bilaterally. No focal
consolidation, pleural effusion or pneumothorax is seen. No masses
are identified.

Upper Abdomen: The visualized portions of the liver and spleen are
unremarkable. The visualized portions of the gallbladder, pancreas,
adrenal glands and kidneys are within normal limits. Slight wall
thickening at the distal esophagus is thought to reflect
intraluminal contents.

Musculoskeletal: No acute osseous abnormalities are identified. The
visualized musculature is unremarkable in appearance.

Review of the MIP images confirms the above findings.
IMPRESSION: 1. No evidence of pulmonary embolus.
2. Lungs clear bilaterally.

## 2019-01-17 DIAGNOSIS — Z124 Encounter for screening for malignant neoplasm of cervix: Secondary | ICD-10-CM | POA: Diagnosis not present

## 2019-01-17 DIAGNOSIS — Z01419 Encounter for gynecological examination (general) (routine) without abnormal findings: Secondary | ICD-10-CM | POA: Diagnosis not present

## 2019-01-17 LAB — HM PAP SMEAR: HM Pap smear: NEGATIVE

## 2019-05-31 ENCOUNTER — Other Ambulatory Visit: Payer: Self-pay

## 2019-05-31 ENCOUNTER — Ambulatory Visit: Payer: PRIVATE HEALTH INSURANCE | Admitting: Nurse Practitioner

## 2019-05-31 ENCOUNTER — Encounter: Payer: Self-pay | Admitting: Nurse Practitioner

## 2019-05-31 VITALS — BP 108/68 | HR 82 | Temp 97.9°F | Resp 14 | Ht 62.0 in | Wt 139.8 lb

## 2019-05-31 DIAGNOSIS — E559 Vitamin D deficiency, unspecified: Secondary | ICD-10-CM

## 2019-05-31 DIAGNOSIS — R002 Palpitations: Secondary | ICD-10-CM

## 2019-05-31 DIAGNOSIS — E663 Overweight: Secondary | ICD-10-CM | POA: Diagnosis not present

## 2019-05-31 DIAGNOSIS — Z0000 Encounter for general adult medical examination without abnormal findings: Secondary | ICD-10-CM | POA: Diagnosis not present

## 2019-05-31 LAB — URINALYSIS
Bilirubin Urine: NEGATIVE
Hgb urine dipstick: NEGATIVE
Ketones, ur: NEGATIVE
Leukocytes,Ua: NEGATIVE
Nitrite: NEGATIVE
Specific Gravity, Urine: 1.01 (ref 1.000–1.030)
Total Protein, Urine: NEGATIVE
Urine Glucose: NEGATIVE
Urobilinogen, UA: 0.2 (ref 0.0–1.0)
pH: 7 (ref 5.0–8.0)

## 2019-05-31 LAB — COMPREHENSIVE METABOLIC PANEL
ALT: 10 U/L (ref 0–35)
AST: 14 U/L (ref 0–37)
Albumin: 4.4 g/dL (ref 3.5–5.2)
Alkaline Phosphatase: 52 U/L (ref 39–117)
BUN: 11 mg/dL (ref 6–23)
CO2: 28 mEq/L (ref 19–32)
Calcium: 9.3 mg/dL (ref 8.4–10.5)
Chloride: 103 mEq/L (ref 96–112)
Creatinine, Ser: 0.57 mg/dL (ref 0.40–1.20)
GFR: 120.61 mL/min (ref >60.00)
Glucose, Bld: 93 mg/dL (ref 70–99)
Potassium: 3.9 mEq/L (ref 3.5–5.1)
Sodium: 137 mEq/L (ref 135–145)
Total Bilirubin: 0.5 mg/dL (ref 0.2–1.2)
Total Protein: 7.6 g/dL (ref 6.0–8.3)

## 2019-05-31 LAB — CBC WITH DIFFERENTIAL/PLATELET
Basophils Absolute: 0 10*3/uL (ref 0.0–0.1)
Basophils Relative: 0.6 % (ref 0.0–3.0)
Eosinophils Absolute: 0.1 10*3/uL (ref 0.0–0.7)
Eosinophils Relative: 2.8 % (ref 0.0–5.0)
HCT: 39.1 % (ref 36.0–46.0)
Hemoglobin: 13 g/dL (ref 12.0–15.0)
Lymphocytes Relative: 53.5 % — ABNORMAL HIGH (ref 12.0–46.0)
Lymphs Abs: 2.4 10*3/uL (ref 0.7–4.0)
MCHC: 33.2 g/dL (ref 30.0–36.0)
MCV: 93.6 fl (ref 78.0–100.0)
Monocytes Absolute: 0.3 10*3/uL (ref 0.1–1.0)
Monocytes Relative: 6.2 % (ref 3.0–12.0)
Neutro Abs: 1.7 10*3/uL (ref 1.4–7.7)
Neutrophils Relative %: 36.9 % — ABNORMAL LOW (ref 43.0–77.0)
Platelets: 244 10*3/uL (ref 150.0–400.0)
RBC: 4.18 Mil/uL (ref 3.87–5.11)
RDW: 13.7 % (ref 11.5–15.5)
WBC: 4.5 10*3/uL (ref 4.0–10.5)

## 2019-05-31 LAB — LIPID PANEL
Cholesterol: 233 mg/dL — ABNORMAL HIGH (ref 0–200)
HDL: 54.7 mg/dL (ref >39.00)
LDL Cholesterol: 164 mg/dL — ABNORMAL HIGH (ref 0–99)
NonHDL: 178.76
Total CHOL/HDL Ratio: 4
Triglycerides: 73 mg/dL (ref 0.0–149.0)
VLDL: 14.6 mg/dL (ref 0.0–40.0)

## 2019-05-31 LAB — TSH: TSH: 3.35 u[IU]/mL (ref 0.35–4.50)

## 2019-05-31 LAB — VITAMIN D 25 HYDROXY (VIT D DEFICIENCY, FRACTURES): VITD: 32.65 ng/mL (ref 30.00–100.00)

## 2019-05-31 NOTE — Patient Instructions (Addendum)
It was great to meet you today.  Continue with your healthy lifestyle.  Congratulations on your impressive weight loss.  Continue to exercise, build muscle and bone strength as you age.  Continue to follow-up with your gynecologist for your Pap test.  Routine laboratory studies today.  We will call you with results.  You may want to begin a multivitamin at this time.    Preventive Care 102-35 Years Old, Female Preventive care refers to visits with your health care provider and lifestyle choices that can promote health and wellness. This includes:  A yearly physical exam. This may also be called an annual well check.  Regular dental visits and eye exams.  Immunizations.  Screening for certain conditions.  Healthy lifestyle choices, such as eating a healthy diet, getting regular exercise, not using drugs or products that contain nicotine and tobacco, and limiting alcohol use. What can I expect for my preventive care visit? Physical exam Your health care provider will check your:  Height and weight. This may be used to calculate body mass index (BMI), which tells if you are at a healthy weight.  Heart rate and blood pressure.  Skin for abnormal spots. Counseling Your health care provider may ask you questions about your:  Alcohol, tobacco, and drug use.  Emotional well-being.  Home and relationship well-being.  Sexual activity.  Eating habits.  Work and work Statistician.  Method of birth control.  Menstrual cycle.  Pregnancy history. What immunizations do I need?  Influenza (flu) vaccine  This is recommended every year. Tetanus, diphtheria, and pertussis (Tdap) vaccine  You may need a Td booster every 10 years. Varicella (chickenpox) vaccine  You may need this if you have not been vaccinated. Human papillomavirus (HPV) vaccine  If recommended by your health care provider, you may need three doses over 6 months. Measles, mumps, and rubella (MMR)  vaccine  You may need at least one dose of MMR. You may also need a second dose. Meningococcal conjugate (MenACWY) vaccine  One dose is recommended if you are age 54-21 years and a first-year college student living in a residence hall, or if you have one of several medical conditions. You may also need additional booster doses. Pneumococcal conjugate (PCV13) vaccine  You may need this if you have certain conditions and were not previously vaccinated. Pneumococcal polysaccharide (PPSV23) vaccine  You may need one or two doses if you smoke cigarettes or if you have certain conditions. Hepatitis A vaccine  You may need this if you have certain conditions or if you travel or work in places where you may be exposed to hepatitis A. Hepatitis B vaccine  You may need this if you have certain conditions or if you travel or work in places where you may be exposed to hepatitis B. Haemophilus influenzae type b (Hib) vaccine  You may need this if you have certain conditions. You may receive vaccines as individual doses or as more than one vaccine together in one shot (combination vaccines). Talk with your health care provider about the risks and benefits of combination vaccines. What tests do I need?  Blood tests  Lipid and cholesterol levels. These may be checked every 5 years starting at age 3.  Hepatitis C test.  Hepatitis B test. Screening  Diabetes screening. This is done by checking your blood sugar (glucose) after you have not eaten for a while (fasting).  Sexually transmitted disease (STD) testing.  BRCA-related cancer screening. This may be done if you have a  family history of breast, ovarian, tubal, or peritoneal cancers.  Pelvic exam and Pap test. This may be done every 3 years starting at age 26. Starting at age 66, this may be done every 5 years if you have a Pap test in combination with an HPV test. Talk with your health care provider about your test results, treatment  options, and if necessary, the need for more tests. Follow these instructions at home: Eating and drinking   Eat a diet that includes fresh fruits and vegetables, whole grains, lean protein, and low-fat dairy.  Take vitamin and mineral supplements as recommended by your health care provider.  Do not drink alcohol if: ? Your health care provider tells you not to drink. ? You are pregnant, may be pregnant, or are planning to become pregnant.  If you drink alcohol: ? Limit how much you have to 0-1 drink a day. ? Be aware of how much alcohol is in your drink. In the [place] one drink equals one 12 oz bottle of beer (355 mL), one 5 oz glass of wine (148 mL), or one 1 oz glass of hard liquor (44 mL). Lifestyle  Take daily care of your teeth and gums.  Stay active. Exercise for at least 30 minutes on 5 or more days each week.  Do not use any products that contain nicotine or tobacco, such as cigarettes, e-cigarettes, and chewing tobacco. If you need help quitting, ask your health care provider.  If you are sexually active, practice safe sex. Use a condom or other form of birth control (contraception) in order to prevent pregnancy and STIs (sexually transmitted infections). If you plan to become pregnant, see your health care provider for a preconception visit. What's next?  Visit your health care provider once a year for a well check visit.  Ask your health care provider how often you should have your eyes and teeth checked.  Stay up to date on all vaccines. This information is not intended to replace advice given to you by your health care provider. Make sure you discuss any questions you have with your health care provider. Document Revised: 10/15/2017 Document Reviewed: 10/15/2017 Elsevier Patient Education  2020 [employer].

## 2019-05-31 NOTE — Progress Notes (Signed)
New Patient Office Visit  Subjective:  Patient ID: Kristin Sellers, female    DOB: [birth year]  Age: 34 y.o. MRN: [id number]  CC:  Chief Complaint  Patient presents with  . Establish Care    New Patient    HPI Kristin Sellers is a  34 year old who would like to establish care with a local provider.    FH -AML- Father died suddenly this year and told her there is a genetic component. She is going to a [facility] in [place] and has an appointment there in August.   History of Vitamin D deficiency: She has no current concerns but would like a vitamin D level checked because she was told she had a low vitamin D level in the past and that was supplemented in 2015 with 100 IU daily.[name]   Overweight  history: BMI 25.57 and she went on Optivia plan and lost 35 lbs and is the maintenance portion now- 135 lb since Nov and she was up to 165 lbs when she started. She got to 130-but is feeling better at her current weight. She takes supplements and is taking vitamin D , zinc, Vit C and elderberry.   Wt Readings from Last 3 Encounters:  05/31/19 139 lb 12.8 oz (63.4 kg)  05/11/18 172 lb (78 kg)  11/30/17 165 lb 12.8 oz (75.2 kg)    History of heart palpitations: Previous Cardiology consult and testing for palpitations no findings on work-up with pregnancy 3 years ago. Attributed to stress and pregnancy. Positive murmur with pregnancy only. HR is lower 70 with wt loss, and if she exercises, she gets rare palpitations. She took a brief trial of metoprolol and has not needed anything since-and she did not tolerate it well with hypotension and feeling"bad".  Non-smoker.   History of elevated lipids: Diet managed and needs recheck with recent weight loss.    Past Medical History:  Diagnosis Date  . Allergy   . Asthma    as a child - no problems as adult - no inhaler  . Bronchitis 10/2015   tx with proair inhaler  . Heart murmur    no problems ever  . History of Bell's palsy   .  History of pre-eclampsia   . Hyperlipidemia    diet controlled - no med  . Palpitations    hx with prior pregnancy  . Pregnancy induced hypertension    Hx GHTN - resolved after 03/2016 delivery  . SVD (spontaneous vaginal delivery)    x 3    Past Surgical History:  Procedure Laterality Date  . LAPAROSCOPIC BILATERAL SALPINGECTOMY Bilateral 06/12/2016   Procedure: LAPAROSCOPIC BILATERAL SALPINGECTOMY;  Surgeon: Vanessa Kick, MD;  Location: [facility];  Service: Gynecology;  Laterality: Bilateral;  . MOUTH SURGERY     wisdom teeth ext  . tubes in ears     as a child    Family History  Problem Relation Age of Onset  . Heart disease Mother   . Hyperlipidemia Mother   . Hypertension Mother   . Heart disease Father   . Hyperlipidemia Father   . Hypertension Father   . Coronary artery disease Father   . Heart attack Father   . Alcohol abuse Father   . Arthritis Father   . Cancer Father   . COPD Father   . Diabetes Father   . Early death Paternal Grandfather   . Coronary artery disease Other   . Hypertension Other   . Heart attack  Other   . Asthma Sister   . Asthma Brother   . Asthma Son   . Cancer Maternal Grandmother   . Diabetes Maternal Grandmother   . Heart disease Sister   . Asthma Son   . Heart disease Paternal Grandmother   . Hyperlipidemia Paternal Grandmother   . Hypertension Paternal Grandmother   . Anesthesia problems Neg Hx   . Hypotension Neg Hx   . Malignant hyperthermia Neg Hx   . Pseudochol deficiency Neg Hx     Social History   Socioeconomic History  . Marital status: Married    Spouse name: Not on file  . Number of children: 2  . Years of education: Not on file  . Highest education level: Not on file  Occupational History  . Occupation: Therapist, sports    Comment: Designer, television/film set: [employer]  Tobacco Use  . Smoking status: Never Smoker  . Smokeless tobacco: Never Used  Substance and Sexual Activity  . Alcohol use:  No  . Drug use: No  . Sexual activity: Yes    Birth control/protection: None  Other Topics Concern  . Not on file  Social History Narrative  . Not on file   Social Determinants of Health   Financial Resource Strain:   . Difficulty of Paying Living Expenses:   Food Insecurity:   . Worried About Charity fundraiser in the Last Year:   . Arboriculturist in the Last Year:   Transportation Needs:   . Film/video editor (Medical):   [name] Lack of Transportation (Non-Medical):   Physical Activity:   . Days of Exercise per Week:   . Minutes of Exercise per Session:   Stress:   . Feeling of Stress :   Social Connections:   . Frequency of Communication with Friends and Family:   . Frequency of Social Gatherings with Friends and Family:   . Attends Religious Services:   . Active Member of Clubs or Organizations:   . Attends Archivist Meetings:   [name] Marital Status:   Intimate Partner Violence:   . Fear of Current or Ex-Partner:   . Emotionally Abused:   [name] Physically Abused:   . Sexually Abused:     ROS Review of Systems  Constitutional: Negative for chills, fever and unexpected weight change.  HENT: Negative for congestion and sore throat.   Eyes: Negative.   Respiratory: Negative for cough and shortness of breath.   Cardiovascular: Positive for palpitations. Negative for chest pain and leg swelling.  Gastrointestinal: Negative for abdominal pain, constipation and diarrhea.  Endocrine: Negative.   Genitourinary: Negative for difficulty urinating, dysuria and frequency.  Musculoskeletal: Negative.   Skin: Negative.   Allergic/Immunologic: Negative.   Neurological: Negative.   Hematological: Negative.   Psychiatric/Behavioral:       No depression/anxiety concerns    Objective:   Today's Vitals: BP 108/68 (BP Location: Left Arm, Patient Position: Sitting, Cuff Size: Normal)   Pulse 82   Temp 97.9 F (36.6 C) (Temporal)   Resp 14   Ht 5' 2"  (1.575 m)   Wt  139 lb 12.8 oz (63.4 kg)   SpO2 98%   BMI 25.57 kg/m   Physical Exam Vitals reviewed.  Constitutional:      Appearance: Normal appearance.  HENT:     Head: Normocephalic and atraumatic.  Eyes:     Extraocular Movements: Extraocular movements intact.     Pupils: Pupils  are equal, round, and reactive to light.  Cardiovascular:     Rate and Rhythm: Normal rate and regular rhythm.     Heart sounds: Normal heart sounds. No murmur.  Pulmonary:     Effort: Pulmonary effort is normal.     Breath sounds: Normal breath sounds.  Abdominal:     General: Abdomen is flat.     Palpations: Abdomen is soft.     Tenderness: There is no abdominal tenderness.  Musculoskeletal:     Cervical back: Normal range of motion and neck supple.  Skin:    General: Skin is warm and dry.  Neurological:     General: No focal deficit present.     Mental Status: She is alert and oriented to person, place, and time.  Psychiatric:        Mood and Affect: Mood normal.        Behavior: Behavior normal.        Thought Content: Thought content normal.        Judgment: Judgment normal.     Assessment & Plan:   Problem List Items Addressed This Visit      Other   Vitamin D deficiency   Relevant Orders   Vitamin D (25 hydroxy)   Visit for preventive health examination - Primary   Relevant Orders   Comp Met (CMET)   Lipid Profile   TSH   CBC with Differential/Platelet   Urinalysis      Outpatient Encounter Medications as of 05/31/2019  Medication Sig  . ibuprofen (ADVIL,MOTRIN) 600 MG tablet Take 1 tablet (600 mg total) by mouth every 6 (six) hours as needed.  . [DISCONTINUED] benzonatate (TESSALON PERLES) 100 MG capsule Take 1 capsule (100 mg total) by mouth 3 (three) times daily as needed.  . [DISCONTINUED] fluticasone (FLONASE) 50 MCG/ACT nasal spray Place 2 sprays into both nostrils daily.  . [DISCONTINUED] fluticasone (FLONASE) 50 MCG/ACT nasal spray Place 2 sprays into both nostrils daily.  .  [DISCONTINUED] montelukast (SINGULAIR) 10 MG tablet Take 1 tablet (10 mg total) by mouth at bedtime.  . [DISCONTINUED] oxyCODONE-acetaminophen (ROXICET) 5-325 MG tablet Take 1-2 tablets by mouth every 4 (four) hours as needed for severe pain. (Patient not taking: Reported on 05/11/2018)  . [DISCONTINUED] PROAIR HFA 108 (90 Base) MCG/ACT inhaler INHALE 2 PUFF(S) EVERY 4 HOURS BY INHALATION ROUTE AS NEEDED FOR SHORTNESS OF BREATH   No facility-administered encounter medications on file as of 05/31/2019.   It was great to meet you today.  Continue with your healthy lifestyle.  Congratulations on your impressive weight loss.  Continue to exercise, build muscle and bone strength as you age.  Continue to follow-up with your gynecologist for your Pap test.  Routine laboratory studies today.  We will call you with results.  You may want to begin a multivitamin at this time.  Let us know the outcome of your genetic consult in Aug for FH AML in father.   Follow-up: Return in about 1 year (around 05/30/2020).   Denice Paradise, NP

## 2019-06-01 ENCOUNTER — Encounter: Payer: Self-pay | Admitting: Nurse Practitioner

## 2019-06-01 NOTE — Assessment & Plan Note (Signed)
She went on Optivia plan and lost 35 lbs and is the maintenance portion now- 135 lb since Nov and she was up to 165 lbs when she started. BMI is 25.57 and she notes when she got below this BMI at a weight of 130 pounds she did not quite feel as well.  She believes she is at her best baseline now.  Patient is on maintenance Optiva plan and is planning to exercise more steadily.

## 2019-06-01 NOTE — Assessment & Plan Note (Signed)
Doing very well now and notices mild palpitations when she exercises, remains asymptomatic.

## 2019-10-09 ENCOUNTER — Other Ambulatory Visit: Payer: Self-pay

## 2019-10-09 ENCOUNTER — Encounter: Payer: Self-pay | Admitting: Emergency Medicine

## 2019-10-09 ENCOUNTER — Ambulatory Visit
Admission: EM | Admit: 2019-10-09 | Discharge: 2019-10-09 | Disposition: A | Discharge status: Home / Self Care | Payer: PRIVATE HEALTH INSURANCE | Attending: Family Medicine | Admitting: Family Medicine

## 2019-10-09 DIAGNOSIS — Z20822 Contact with and (suspected) exposure to covid-19: Secondary | ICD-10-CM | POA: Diagnosis present

## 2019-10-09 DIAGNOSIS — B349 Viral infection, unspecified: Secondary | ICD-10-CM | POA: Insufficient documentation

## 2019-10-09 MED ORDER — BUTALBITAL-APAP-CAFFEINE 50-325-40 MG PO TABS: 1.0000 | ORAL_TABLET | Freq: Four times a day (QID) | ORAL | 0 refills | Status: AC | PRN

## 2019-10-09 NOTE — ED Provider Notes (Signed)
[facility]    CSN: [id number] Arrival date & time: 10/09/19  1019      History   Chief Complaint Chief Complaint  Patient presents with  . Cough   HPI  34 year old female presents with headache, cough, fever, tachycardia.  Patient reports that her symptoms started about 5 days ago.  She is currently afebrile.  She is most bothered by headache and palpitations.  She has had a Covid test which has been negative.  No reported sick contacts.  She states that her headache has not improved despite over-the-counter treatment.  No known inciting factor.  No known exacerbating factors.  No other complaints.  Past Medical History:  Diagnosis Date  . Allergy   . Asthma    as a child - no problems as adult - no inhaler  . Bronchitis 10/2015   tx with proair inhaler  . Heart murmur    no problems ever  . History of Bell's palsy   . History of pre-eclampsia   . Hyperlipidemia    diet controlled - no med  . Palpitations    hx with prior pregnancy  . Pregnancy induced hypertension    Hx GHTN - resolved after 03/2016 delivery  . SVD (spontaneous vaginal delivery)    x 3    Patient Active Problem List   Diagnosis Date Noted  . Vitamin D deficiency 11/29/2013  . Overweight 11/29/2013  . History of elevated lipids 09/07/2013  . Heart palpitations 10/04/2010    Past Surgical History:  Procedure Laterality Date  . LAPAROSCOPIC BILATERAL SALPINGECTOMY Bilateral 06/12/2016   Procedure: LAPAROSCOPIC BILATERAL SALPINGECTOMY;  Surgeon: Waynard Reeds, MD;  Location: [facility];  Service: Gynecology;  Laterality: Bilateral;  . MOUTH SURGERY     wisdom teeth ext  . tubes in ears     as a child    OB History    Gravida  3   Para  3   Term  3   Preterm  0   AB  0   Living  3     SAB  0   TAB  0   Ectopic  0   Multiple  0   Live Births  3            Home Medications    Prior to Admission medications   Medication Sig Start Date End Date Taking?  Authorizing Provider  butalbital-acetaminophen-caffeine (FIORICET) [id number] MG tablet Take 1 tablet by mouth every 6 (six) hours as needed for headache. 10/09/19 10/08/20  Tommie Sams, DO    Family History Family History  Problem Relation Age of Onset  . Heart disease Mother   . Hyperlipidemia Mother   . Hypertension Mother   . Heart disease Father   . Hyperlipidemia Father   . Hypertension Father   . Coronary artery disease Father   . Heart attack Father   . Alcohol abuse Father   . Arthritis Father   . Cancer Father   . COPD Father   . Diabetes Father   . Early death Paternal Grandfather   . Coronary artery disease Other   . Hypertension Other   . Heart attack Other   . Asthma Sister   . Asthma Brother   . Asthma Son   . Cancer Maternal Grandmother   . Diabetes Maternal Grandmother   . Heart disease Sister   . Asthma Son   . Heart disease Paternal Grandmother   . Hyperlipidemia Paternal Grandmother   .  Hypertension Paternal Grandmother   . Anesthesia problems Neg Hx   . Hypotension Neg Hx   . Malignant hyperthermia Neg Hx   . Pseudochol deficiency Neg Hx     Social History Social History   Tobacco Use  . Smoking status: Never Smoker  . Smokeless tobacco: Never Used  Substance Use Topics  . Alcohol use: No  . Drug use: No     Allergies   Sulfa antibiotics   Review of Systems Review of Systems  Constitutional: Positive for fever.  Respiratory: Positive for cough.   Cardiovascular: Positive for palpitations.  Neurological: Positive for headaches.   Physical Exam Triage Vital Signs ED Triage Vitals  Enc Vitals Group     BP 10/09/19 1115 (!) 144/90     Pulse Rate 10/09/19 1115 98     Resp 10/09/19 1115 18     Temp 10/09/19 1115 98.8 F (37.1 C)     Temp Source 10/09/19 1115 Oral     SpO2 10/09/19 1115 100 %     Weight 10/09/19 1112 139 lb 12.4 oz (63.4 kg)     Height 10/09/19 1112 5\' 2"  (1.575 m)     Head Circumference --      Peak Flow --        Pain Score 10/09/19 1112 3     Pain Loc --      Pain Edu? --      Excl. in GC? --    Updated Vital Signs BP (!) 144/90 (BP Location: Right Arm)   Pulse 98   Temp 98.8 F (37.1 C) (Oral)   Resp 18   Ht 5\' 2"  (1.575 m)   Wt 63.4 kg   LMP 10/05/2019   SpO2 100%   Breastfeeding No   BMI 25.56 kg/m   Visual Acuity Right Eye Distance:   Left Eye Distance:   Bilateral Distance:    Right Eye Near:   Left Eye Near:    Bilateral Near:     Physical Exam Vitals and nursing note reviewed.  Constitutional:      General: She is not in acute distress.    Appearance: Normal appearance. She is not ill-appearing.  HENT:     Head: Normocephalic and atraumatic.  Eyes:     General:        Right eye: No discharge.        Left eye: No discharge.     Conjunctiva/sclera: Conjunctivae normal.  Cardiovascular:     Rate and Rhythm: Normal rate and regular rhythm.  Pulmonary:     Effort: Pulmonary effort is normal.     Breath sounds: Normal breath sounds. No wheezing, rhonchi or rales.  Neurological:     Mental Status: She is alert.  Psychiatric:        Mood and Affect: Mood normal.        Behavior: Behavior normal.    UC Treatments / Results  Labs (all labs ordered are listed, but only abnormal results are displayed) Labs Reviewed  SARS CORONAVIRUS 2 (TAT 6-24 HRS)    EKG   Radiology No results found.  Procedures Procedures (including critical care time)  Medications Ordered in UC Medications - No data to display  Initial Impression / Assessment and Plan / UC Course  I have reviewed the triage vital signs and the nursing notes.  Pertinent labs & imaging results that were available during my care of the patient were reviewed by me and considered in my medical decision making (  see chart for details).    34 year old female presents with a viral illness.  Possible COVID-19.  Awaiting test results.  Fioricet for headache.  Supportive care.  Final Clinical  Impressions(s) / UC Diagnoses   Final diagnoses:  Viral illness  Encounter for laboratory testing for COVID-19 virus     Discharge Instructions     Rest.  Medication as prescribed.  COVID test will be back tomorrow.  Take care  Dr. Adriana Simas    ED Prescriptions    Medication Sig Dispense Auth. Provider   butalbital-acetaminophen-caffeine (FIORICET) 50-325-40 MG tablet Take 1 tablet by mouth every 6 (six) hours as needed for headache. 20 tablet Tommie Sams, DO     PDMP not reviewed this encounter.   Tommie Sams, [place] 10/09/19 1401

## 2019-10-09 NOTE — ED Triage Notes (Signed)
Pt c/o headache, cough, fatigue and fever (100.5). Started about 5 days ago. She states she had a rapid covid through [facility] that was negative.

## 2019-10-09 NOTE — Discharge Instructions (Addendum)
Rest.  Medication as prescribed.  COVID test will be back tomorrow.  Take care  Dr. Adriana Simas

## 2019-10-10 LAB — SARS CORONAVIRUS 2 (TAT 6-24 HRS): SARS Coronavirus 2: NEGATIVE

## 2019-10-13 ENCOUNTER — Telehealth: Payer: PRIVATE HEALTH INSURANCE | Admitting: Emergency Medicine

## 2019-10-13 ENCOUNTER — Other Ambulatory Visit: Payer: Self-pay | Admitting: Family Medicine

## 2019-10-13 ENCOUNTER — Encounter: Payer: Self-pay | Admitting: Emergency Medicine

## 2019-10-13 ENCOUNTER — Other Ambulatory Visit: Payer: Self-pay

## 2019-10-13 ENCOUNTER — Ambulatory Visit
Admission: EM | Admit: 2019-10-13 | Discharge: 2019-10-13 | Disposition: A | Discharge status: Home / Self Care | Payer: PRIVATE HEALTH INSURANCE | Attending: Family Medicine | Admitting: Family Medicine

## 2019-10-13 DIAGNOSIS — R6889 Other general symptoms and signs: Secondary | ICD-10-CM

## 2019-10-13 DIAGNOSIS — R002 Palpitations: Secondary | ICD-10-CM | POA: Diagnosis not present

## 2019-10-13 MED ORDER — METOPROLOL TARTRATE 25 MG PO TABS: 12.5000 mg | ORAL_TABLET | Freq: Two times a day (BID) | ORAL | 0 refills | Status: DC | PRN

## 2019-10-13 NOTE — ED Triage Notes (Signed)
Patient c/o increased heart rate that started 7-8 days ago. She states her resting heart rate today was 110. She states today she became dizzy. She had a e-visit and was advised to be seen for an EKG.

## 2019-10-13 NOTE — Progress Notes (Signed)
Based on what you shared with me, I feel your condition warrants further evaluation and I recommend that you be seen for a face to face office visit.  First, sometimes the Covid test can be negative, especially if it was a point of care test, and not a PCR test. Second, if your resting heart rate is up to 120, even with your history of palpitations, you need to be seen in person for labs, EKG and more in depth work up than I can provide on this platform.   NOTE: If you entered your credit card information for this eVisit, you will not be charged. You may see a "hold" on your card for the $35 but that hold will drop off and you will not have a charge processed.   If you are having a true medical emergency please call 911.      For an urgent face to face visit, [facility] has five urgent care centers for your convenience:      NEW:  [facility] at [facility] Directions [phone] [address] [place], [place] [address] . 10 am - 6pm Monday - Friday    [facility]) Get Driving Directions [phone] [address] [place], [place] [address] . 10 am to 8 pm Monday-Friday . 12 pm to 8 pm [facility] Care at [facility] Get Driving Directions [phone] [address] [place], [place] [address] . 8 am to 8 pm Monday-Friday . 9 am to 6 pm Saturday . 11 am to 6 pm Sunday     [facility] at [facility] Get Driving Directions  [phone] [address] [place], [place] [address] . 8 am to 8 pm Monday-Friday . 8 am to 4 pm [facility] Urgent Care at [facility] Directions [phone] [address] [place], [place] [address] . 12 pm to 6 pm Monday-Friday      Your e-visit answers were reviewed by a board certified advanced clinical practitioner to complete your personal care plan.   Thank you for using e-Visits.   **Please do not respond to this message unless you have follow up questions.** Greater than 5 but less than 10 minutes spent researching, coordinating, and implementing care for this patient today

## 2019-10-13 NOTE — ED Provider Notes (Signed)
[facility]    CSN: [id number] Arrival date & time: 10/13/19  1642      History   Chief Complaint Chief Complaint  Patient presents with  . Palpitations   HPI  34 year old female presents with the above complaint.  Patient recently seen by me on 8/22.  Was diagnosed with a viral illness.  She had complaints of palpitations at that time.  She had negative Covid test.  Patient reports that she continues to be bothered by palpitations.  She states that she has periods of time where she is tachycardic and feels like her chest is tight.  This is bothering her symptomatically.  She seems to be doing well from respiratory standpoint regarding her viral illness.  She has had this previously and responded well to metoprolol.  She would like to discuss this today.  No other complaints.  Past Medical History:  Diagnosis Date  . Allergy   . Asthma    as a child - no problems as adult - no inhaler  . Bronchitis 10/2015   tx with proair inhaler  . Heart murmur    no problems ever  . History of Bell's palsy   . History of pre-eclampsia   . Hyperlipidemia    diet controlled - no med  . Palpitations    hx with prior pregnancy  . Pregnancy induced hypertension    Hx GHTN - resolved after 03/2016 delivery  . SVD (spontaneous vaginal delivery)    x 3    Patient Active Problem List   Diagnosis Date Noted  . Vitamin D deficiency 11/29/2013  . Overweight 11/29/2013  . History of elevated lipids 09/07/2013  . Heart palpitations 10/04/2010    Past Surgical History:  Procedure Laterality Date  . LAPAROSCOPIC BILATERAL SALPINGECTOMY Bilateral 06/12/2016   Procedure: LAPAROSCOPIC BILATERAL SALPINGECTOMY;  Surgeon: Waynard Reeds, MD;  Location: [facility];  Service: Gynecology;  Laterality: Bilateral;  . MOUTH SURGERY     wisdom teeth ext  . tubes in ears     as a child    OB History    Gravida  3   Para  3   Term  3   Preterm  0   AB  0   Living  3     SAB  0   TAB   0   Ectopic  0   Multiple  0   Live Births  3            Home Medications    Prior to Admission medications   Medication Sig Start Date End Date Taking? Authorizing Provider  butalbital-acetaminophen-caffeine (FIORICET) 50-325-40 MG tablet Take 1 tablet by mouth every 6 (six) hours as needed for headache. 10/09/19 10/08/20 Yes Everlene Other G, DO  metoprolol tartrate (LOPRESSOR) 25 MG tablet Take 0.5 tablets (12.5 mg total) by mouth 2 (two) times daily as needed (Palpitations). 10/13/19   Tommie Sams, DO    Family History Family History  Problem Relation Age of Onset  . Heart disease Mother   . Hyperlipidemia Mother   . Hypertension Mother   . Heart disease Father   . Hyperlipidemia Father   . Hypertension Father   . Coronary artery disease Father   . Heart attack Father   . Alcohol abuse Father   . Arthritis Father   . Cancer Father   . COPD Father   . Diabetes Father   . Early death Paternal Grandfather   . Coronary artery disease Other   .  Hypertension Other   . Heart attack Other   . Asthma Sister   . Asthma Brother   . Asthma Son   . Cancer Maternal Grandmother   . Diabetes Maternal Grandmother   . Heart disease Sister   . Asthma Son   . Heart disease Paternal Grandmother   . Hyperlipidemia Paternal Grandmother   . Hypertension Paternal Grandmother   . Anesthesia problems Neg Hx   . Hypotension Neg Hx   . Malignant hyperthermia Neg Hx   . Pseudochol deficiency Neg Hx     Social History Social History   Tobacco Use  . Smoking status: Never Smoker  . Smokeless tobacco: Never Used  Substance Use Topics  . Alcohol use: No  . Drug use: No     Allergies   Sulfa antibiotics   Review of Systems Review of Systems  Cardiovascular: Positive for palpitations.   Physical Exam Triage Vital Signs ED Triage Vitals  Enc Vitals Group     BP 10/13/19 1745 (!) 140/100     Pulse Rate 10/13/19 1745 94     Resp 10/13/19 1745 18     Temp 10/13/19 1745  (!) 96 F (35.6 C)     Temp Source 10/13/19 1745 Oral     SpO2 10/13/19 1745 100 %     Weight 10/13/19 1743 146 lb (66.2 kg)     Height 10/13/19 1743 5\' 2"  (1.575 m)     Head Circumference --      Peak Flow --      Pain Score 10/13/19 1743 0     Pain Loc --      Pain Edu? --      Excl. in GC? --    Updated Vital Signs BP (!) 140/100 (BP Location: Right Arm)   Pulse 94   Temp (!) 96 F (35.6 C) (Oral)   Resp 18   Ht 5\' 2"  (1.575 m)   Wt 66.2 kg   LMP 10/05/2019   SpO2 100%   BMI 26.70 kg/m   Visual Acuity Right Eye Distance:   Left Eye Distance:   Bilateral Distance:    Right Eye Near:   Left Eye Near:    Bilateral Near:     Physical Exam Vitals and nursing note reviewed.  Constitutional:      General: She is not in acute distress.    Appearance: Normal appearance. She is not ill-appearing.  HENT:     Head: Normocephalic and atraumatic.  Eyes:     General:        Right eye: No discharge.        Left eye: No discharge.     Conjunctiva/sclera: Conjunctivae normal.  Cardiovascular:     Rate and Rhythm: Normal rate and regular rhythm.  Pulmonary:     Effort: Pulmonary effort is normal.     Breath sounds: Normal breath sounds. No wheezing, rhonchi or rales.  Neurological:     Mental Status: She is alert.  Psychiatric:        Mood and Affect: Mood normal.        Behavior: Behavior normal.    UC Treatments / Results  Labs (all labs ordered are listed, but only abnormal results are displayed) Labs Reviewed - No data to display  EKG Interpretation: Normal sinus rhythm with rate of 88.  Normal axis.  Normal intervals.  Normal EKG.  Radiology No results found.  Procedures Procedures (including critical care time)  Medications Ordered in UC Medications -  No data to display  Initial Impression / Assessment and Plan / UC Course  I have reviewed the triage vital signs and the nursing notes.  Pertinent labs & imaging results that were available during my  care of the patient were reviewed by me and considered in my medical decision making (see chart for details).    34 year old female presents with palpitations.  Normal EKG here today.  Placing on metoprolol.  Final Clinical Impressions(s) / UC Diagnoses   Final diagnoses:  Palpitations   Discharge Instructions   None    ED Prescriptions    Medication Sig Dispense Auth. Provider   metoprolol tartrate (LOPRESSOR) 25 MG tablet Take 0.5 tablets (12.5 mg total) by mouth 2 (two) times daily as needed (Palpitations). 90 tablet Everlene Other G, DO     PDMP not reviewed this encounter.   Tommie Sams, [place] 10/13/19 2049

## 2019-10-18 ENCOUNTER — Telehealth: Payer: PRIVATE HEALTH INSURANCE | Admitting: Physician Assistant

## 2019-10-18 DIAGNOSIS — N390 Urinary tract infection, site not specified: Secondary | ICD-10-CM | POA: Diagnosis not present

## 2019-10-18 MED ORDER — CEPHALEXIN 500 MG PO CAPS: ORAL_CAPSULE | ORAL | 0 refills | Status: DC

## 2019-10-18 NOTE — Progress Notes (Signed)

## 2019-10-27 ENCOUNTER — Telehealth: Payer: PRIVATE HEALTH INSURANCE | Admitting: Emergency Medicine

## 2019-10-27 DIAGNOSIS — N898 Other specified noninflammatory disorders of vagina: Secondary | ICD-10-CM

## 2019-10-27 MED ORDER — FLUCONAZOLE 150 MG PO TABS: 150.0000 mg | ORAL_TABLET | Freq: Once | ORAL | 0 refills | Status: AC

## 2019-10-27 NOTE — Progress Notes (Signed)
We are sorry that you are not feeling well. Here is how we plan to help! Based on what you shared with me it looks like you: May have a yeast vaginosis  Vaginosis is an inflammation of the vagina that can result in discharge, itching and pain. The cause is usually a change in the normal balance of vaginal bacteria or an infection. Vaginosis can also result from reduced estrogen levels after menopause.  The most common causes of vaginosis are:   Bacterial vaginosis which results from an overgrowth of one on several organisms that are normally present in your vagina.   Yeast infections which are caused by a naturally occurring fungus called candida.   Vaginal atrophy (atrophic vaginosis) which results from the thinning of the vagina from reduced estrogen levels after menopause.   Trichomoniasis which is caused by a parasite and is commonly transmitted by sexual intercourse.  Factors that increase your risk of developing vaginosis include: . Medications, such as antibiotics and steroids . Uncontrolled diabetes . Use of hygiene products such as bubble bath, vaginal spray or vaginal deodorant . Douching . Wearing damp or tight-fitting clothing . Using an intrauterine device (IUD) for birth control . Hormonal changes, such as those associated with pregnancy, birth control pills or menopause . Sexual activity . Having a sexually transmitted infection  Your treatment plan is A single Diflucan (fluconazole) 150mg tablet once.  I have electronically sent this prescription into the pharmacy that you have chosen.  Be sure to take all of the medication as directed. Stop taking any medication if you develop a rash, tongue swelling or shortness of breath. Mothers who are breast feeding should consider pumping and discarding their breast milk while on these antibiotics. However, there is no consensus that infant exposure at these doses would be harmful.  Remember that medication creams can weaken latex  condoms. .   HOME CARE:  Good hygiene may prevent some types of vaginosis from recurring and may relieve some symptoms:  . Avoid baths, hot tubs and whirlpool spas. Rinse soap from your outer genital area after a shower, and dry the area well to prevent irritation. Don't use scented or harsh soaps, such as those with deodorant or antibacterial action. . Avoid irritants. These include scented tampons and pads. . Wipe from front to back after using the toilet. Doing so avoids spreading fecal bacteria to your vagina.  Other things that may help prevent vaginosis include:  . Don't douche. Your vagina doesn't require cleansing other than normal bathing. Repetitive douching disrupts the normal organisms that reside in the vagina and can actually increase your risk of vaginal infection. Douching won't clear up a vaginal infection. . Use a latex condom. Both female and female latex condoms may help you avoid infections spread by sexual contact. . Wear cotton underwear. Also wear pantyhose with a cotton crotch. If you feel comfortable without it, skip wearing underwear to bed. Yeast thrives in moist environments Your symptoms should improve in the next day or two.  GET HELP RIGHT AWAY IF:  . You have pain in your lower abdomen ( pelvic area or over your ovaries) . You develop nausea or vomiting . You develop a fever . Your discharge changes or worsens . You have persistent pain with intercourse . You develop shortness of breath, a rapid pulse, or you faint.  These symptoms could be signs of problems or infections that need to be evaluated by a medical provider now.  MAKE SURE YOU      Understand these instructions.  Will watch your condition.  Will get help right away if you are not doing well or get worse.  Your e-visit answers were reviewed by a board certified advanced clinical practitioner to complete your personal care plan. Depending upon the condition, your plan could have included  both over the counter or prescription medications. Please review your pharmacy choice to make sure that you have choses a pharmacy that is open for you to pick up any needed prescription, Your safety is important to us. If you have drug allergies check your prescription carefully.   You can use MyChart to ask questions about today's visit, request a non-urgent call back, or ask for a work or school excuse for 24 hours related to this e-Visit. If it has been greater than 24 hours you will need to follow up with your provider, or enter a new e-Visit to address those concerns. You will get a MyChart message within the next two days asking about your experience. I hope that your e-visit has been valuable and will speed your recovery.  Approximately 5 minutes was used in reviewing the patient's chart, questionnaire, prescribing medications, and documentation.  

## 2019-11-16 ENCOUNTER — Encounter: Payer: Self-pay | Admitting: Nurse Practitioner

## 2019-11-18 ENCOUNTER — Ambulatory Visit
Admission: EM | Admit: 2019-11-18 | Discharge: 2019-11-18 | Disposition: A | Discharge status: Home / Self Care | Payer: 59 | Attending: Family Medicine | Admitting: Family Medicine

## 2019-11-18 ENCOUNTER — Other Ambulatory Visit: Payer: Self-pay | Admitting: Family Medicine

## 2019-11-18 ENCOUNTER — Other Ambulatory Visit: Payer: Self-pay

## 2019-11-18 DIAGNOSIS — Z1152 Encounter for screening for COVID-19: Secondary | ICD-10-CM | POA: Diagnosis not present

## 2019-11-18 DIAGNOSIS — R0981 Nasal congestion: Secondary | ICD-10-CM

## 2019-11-18 DIAGNOSIS — R059 Cough, unspecified: Secondary | ICD-10-CM | POA: Diagnosis not present

## 2019-11-18 DIAGNOSIS — J0100 Acute maxillary sinusitis, unspecified: Secondary | ICD-10-CM

## 2019-11-18 MED ORDER — AMOXICILLIN-POT CLAVULANATE 875-125 MG PO TABS: 1.0000 | ORAL_TABLET | Freq: Two times a day (BID) | ORAL | 0 refills | Status: DC

## 2019-11-18 MED ORDER — FLUCONAZOLE 200 MG PO TABS: 200.0000 mg | ORAL_TABLET | Freq: Once | ORAL | 0 refills | Status: DC

## 2019-11-18 MED ORDER — BENZONATATE 100 MG PO CAPS: 100.0000 mg | ORAL_CAPSULE | Freq: Three times a day (TID) | ORAL | 0 refills | Status: DC

## 2019-11-18 NOTE — ED Provider Notes (Signed)
[facility]   [id number] 11/18/19 Arrival Time: 5427   CC: COVID symptoms  SUBJECTIVE: History from: patient.  Kristin Sellers is a 34 y.o. female who presents with abrupt onset of nasal congestion, PND, sinus pain and persistent dry cough for 4 days. Denies sick exposure to COVID, flu or strep. Denies recent travel. Has negative history of Covid. Has completed Covid vaccines. Has not taken OTC medications for this. There are no aggravating or alleviating factors. Denies previous symptoms in the past. Denies fever, chills, fatigue, sinus pain, sore throat, SOB, wheezing, chest pain, nausea, changes in bowel or bladder habits.    ROS: As per HPI.  All other pertinent ROS negative.     Past Medical History:  Diagnosis Date  . Allergy   . Asthma    as a child - no problems as adult - no inhaler  . Bronchitis 10/2015   tx with proair inhaler  . Heart murmur    no problems ever  . History of Bell's palsy   . History of pre-eclampsia   . Hyperlipidemia    diet controlled - no med  . Palpitations    hx with prior pregnancy  . Pregnancy induced hypertension    Hx GHTN - resolved after 03/2016 delivery  . SVD (spontaneous vaginal delivery)    x 3   Past Surgical History:  Procedure Laterality Date  . LAPAROSCOPIC BILATERAL SALPINGECTOMY Bilateral 06/12/2016   Procedure: LAPAROSCOPIC BILATERAL SALPINGECTOMY;  Surgeon: Waynard Reeds, MD;  Location: [facility];  Service: Gynecology;  Laterality: Bilateral;  . MOUTH SURGERY     wisdom teeth ext  . tubes in ears     as a child   Allergies  Allergen Reactions  . Sulfa Antibiotics Hives   No current facility-administered medications on file prior to encounter.   Current Outpatient Medications on File Prior to Encounter  Medication Sig Dispense Refill  . butalbital-acetaminophen-caffeine (FIORICET) 50-325-40 MG tablet Take 1 tablet by mouth every 6 (six) hours as needed for headache. 20 tablet 0  . cephALEXin (KEFLEX) 500 MG  capsule 1 cap po bid x 7 days 14 capsule 0  . metoprolol tartrate (LOPRESSOR) 25 MG tablet Take 0.5 tablets (12.5 mg total) by mouth 2 (two) times daily as needed (Palpitations). 90 tablet 0   Social History   Socioeconomic History  . Marital status: Married    Spouse name: Not on file  . Number of children: 2  . Years of education: Not on file  . Highest education level: Not on file  Occupational History  . Occupation: Charity fundraiser    Comment: Diplomatic Services operational officer: [employer]  Tobacco Use  . Smoking status: Never Smoker  . Smokeless tobacco: Never Used  Substance and Sexual Activity  . Alcohol use: No  . Drug use: No  . Sexual activity: Yes    Birth control/protection: None  Other Topics Concern  . Not on file  Social History Narrative  . Not on file   Social Determinants of Health   Financial Resource Strain:   . Difficulty of Paying Living Expenses: Not on file  Food Insecurity:   . Worried About Programme researcher, broadcasting/film/video in the Last Year: Not on file  . Ran Out of Food in the Last Year: Not on file  Transportation Needs:   . Lack of Transportation (Medical): Not on file  . Lack of Transportation (Non-Medical): Not on file  Physical Activity:   .  Days of Exercise per Week: Not on file  . Minutes of Exercise per Session: Not on file  Stress:   . Feeling of Stress : Not on file  Social Connections:   . Frequency of Communication with Friends and Family: Not on file  . Frequency of Social Gatherings with Friends and Family: Not on file  . Attends Religious Services: Not on file  . Active Member of Clubs or Organizations: Not on file  . Attends Banker Meetings: Not on file  . Marital Status: Not on file  Intimate Partner Violence:   . Fear of Current or Ex-Partner: Not on file  . Emotionally Abused: Not on file  . Physically Abused: Not on file  . Sexually Abused: Not on file   Family History  Problem Relation Age of Onset  .  Heart disease Mother   . Hyperlipidemia Mother   . Hypertension Mother   . Heart disease Father   . Hyperlipidemia Father   . Hypertension Father   . Coronary artery disease Father   . Heart attack Father   . Alcohol abuse Father   . Arthritis Father   . Cancer Father   . COPD Father   . Diabetes Father   . Early death Paternal Grandfather   . Coronary artery disease Other   . Hypertension Other   . Heart attack Other   . Asthma Sister   . Asthma Brother   . Asthma Son   . Cancer Maternal Grandmother   . Diabetes Maternal Grandmother   . Heart disease Sister   . Asthma Son   . Heart disease Paternal Grandmother   . Hyperlipidemia Paternal Grandmother   . Hypertension Paternal Grandmother   . Anesthesia problems Neg Hx   . Hypotension Neg Hx   . Malignant hyperthermia Neg Hx   . Pseudochol deficiency Neg Hx     OBJECTIVE:  Vitals:   11/18/19 0848  BP: 119/81  Pulse: 83  Resp: 16  Temp: 98.9 F (37.2 C)  SpO2: 98%     General appearance: alert; appears fatigued, but nontoxic; speaking in full sentences and tolerating own secretions HEENT: NCAT; Ears: EACs clear, TMs pearly gray; Eyes: PERRL.  EOM grossly intact. Sinuses: maxillary sinus tenderness; Nose: nares patent without rhinorrhea, Throat: oropharynx clear, tonsils non erythematous or enlarged, uvula midline  Neck: supple without LAD Lungs: unlabored respirations, symmetrical air entry; cough: absent; no respiratory distress; CTAB Heart: regular rate and rhythm.  Radial pulses 2+ symmetrical bilaterally Skin: warm and dry Psychological: alert and cooperative; normal mood and affect  LABS:  No results found for this or any previous visit (from the past 24 hour(s)).   ASSESSMENT & PLAN:  1. Acute non-recurrent maxillary sinusitis   2. Cough   3. Nasal congestion   4. Encounter for screening for COVID-19     Meds ordered this encounter  Medications  . amoxicillin-clavulanate (AUGMENTIN) 875-125 MG  tablet    Sig: Take 1 tablet by mouth 2 (two) times daily for 10 days.    Dispense:  20 tablet    Refill:  0    Order Specific Question:   Supervising Provider    Answer:   Merrilee Jansky [id number]  . benzonatate (TESSALON) 100 MG capsule    Sig: Take 1 capsule (100 mg total) by mouth every 8 (eight) hours.    Dispense:  21 capsule    Refill:  0    Order Specific Question:   Supervising Provider  AnswerMerrilee Jansky [id number]  . fluconazole (DIFLUCAN) 200 MG tablet    Sig: Take 1 tablet (200 mg total) by mouth once for 1 dose.    Dispense:  2 tablet    Refill:  0    Order Specific Question:   Supervising Provider    Answer:   Merrilee Jansky [id number]    Prescribed Augmentin Prescribed tessalon perles Prescribed fluconazole in case of yeast  COVID testing ordered.  It will take between 1-2 days for test results.  Someone will contact you regarding abnormal results.    Patient should remain in quarantine until they have received Covid results.  If negative you may resume normal activities (go back to work/school) while practicing hand hygiene, social distance, and mask wearing.  If positive, patient should remain in quarantine for 10 days from symptom onset AND greater than 72 hours after symptoms resolution (absence of fever without the use of fever-reducing medication and improvement in respiratory symptoms), whichever is longer Get plenty of rest and push fluids Use OTC zyrtec for nasal congestion, runny nose, and/or sore throat Use OTC flonase for nasal congestion and runny nose Use medications daily for symptom relief Use OTC medications like ibuprofen or tylenol as needed fever or pain Call or go to the ED if you have any new or worsening symptoms such as fever, worsening cough, shortness of breath, chest tightness, chest pain, turning blue, changes in mental status.  Reviewed expectations re: course of current medical issues. Questions answered. Outlined signs  and symptoms indicating need for more acute intervention. Patient verbalized understanding. After Visit Summary given.         Moshe Cipro, NP 11/18/19 [phone]

## 2019-11-18 NOTE — ED Triage Notes (Signed)
Patent reports she was sent here by [facility].   Patient complains of sinus congestion, nasal drainage, and cough x4 days. Agreeable to COVID testing.

## 2019-11-18 NOTE — Discharge Instructions (Addendum)
I have sent in Augmentin for your sinus infection  Take this medication twice a day for 10 days  I have sent in tessalon perles for your cough  I have sent in fluconazole in case of yeast  Your COVID test is pending.  You should self quarantine until the test result is back.    Take Tylenol as needed for fever or discomfort.  Rest and keep yourself hydrated.    Go to the emergency department if you develop acute worsening symptoms.

## 2019-11-19 LAB — NOVEL CORONAVIRUS, NAA: SARS-CoV-2, NAA: NOT DETECTED

## 2019-11-19 LAB — SARS-COV-2, NAA 2 DAY TAT

## 2019-11-22 ENCOUNTER — Telehealth: Payer: 59 | Admitting: Nurse Practitioner

## 2019-11-22 DIAGNOSIS — R5081 Fever presenting with conditions classified elsewhere: Secondary | ICD-10-CM

## 2019-11-22 DIAGNOSIS — R059 Cough, unspecified: Secondary | ICD-10-CM

## 2019-11-22 MED ORDER — AZITHROMYCIN 250 MG PO TABS: ORAL_TABLET | ORAL | 0 refills | Status: DC

## 2019-11-22 NOTE — Progress Notes (Signed)
We are sorry that you are not feeling well.  Here is how we plan to help!  Based on your presentation I believe you most likely have A cough due to bacteria.  When patients have a fever and a productive cough with a change in color or increased sputum production, we are concerned about bacterial bronchitis.  If left untreated it can progress to pneumonia.  If your symptoms do not improve with your treatment plan it is important that you contact your provider.   I have prescribed Azithromyin 250 mg: two tablets now and then one tablet daily for 4 additonal days    * changed augmentin to zpak to cover any possib;e pneumonia.  In addition you may use A non-prescription cough medication called Mucinex DM: take 2 tablets every 12 hours.   From your responses in the eVisit questionnaire you describe inflammation in the upper respiratory tract which is causing a significant cough.  This is commonly called Bronchitis and has four common causes:    Allergies  Viral Infections  Acid Reflux  Bacterial Infection Allergies, viruses and acid reflux are treated by controlling symptoms or eliminating the cause. An example might be a cough caused by taking certain blood pressure medications. You stop the cough by changing the medication. Another example might be a cough caused by acid reflux. Controlling the reflux helps control the cough.  USE OF BRONCHODILATOR ("RESCUE") INHALERS: There is a risk from using your bronchodilator too frequently.  The risk is that over-reliance on a medication which only relaxes the muscles surrounding the breathing tubes can reduce the effectiveness of medications prescribed to reduce swelling and congestion of the tubes themselves.  Although you feel brief relief from the bronchodilator inhaler, your asthma may actually be worsening with the tubes becoming more swollen and filled with mucus.  This can delay other crucial treatments, such as oral steroid medications. If you need  to use a bronchodilator inhaler daily, several times per day, you should discuss this with your provider.  There are probably better treatments that could be used to keep your asthma under control.     HOME CARE . Only take medications as instructed by your medical team. . Complete the entire course of an antibiotic. . Drink plenty of fluids and get plenty of rest. . Avoid close contacts especially the very young and the elderly . Cover your mouth if you cough or cough into your sleeve. . Always remember to wash your hands . A steam or ultrasonic humidifier can help congestion.   GET HELP RIGHT AWAY IF: . You develop worsening fever. . You become short of breath . You cough up blood. . Your symptoms persist after you have completed your treatment plan MAKE SURE YOU   Understand these instructions.  Will watch your condition.  Will get help right away if you are not doing well or get worse.  Your e-visit answers were reviewed by a board certified advanced clinical practitioner to complete your personal care plan.  Depending on the condition, your plan could have included both over the counter or prescription medications. If there is a problem please reply  once you have received a response from your provider. Your safety is important to [place].  If you have drug allergies check your prescription carefully.    You can use MyChart to ask questions about today's visit, request a non-urgent call back, or ask for a work or school excuse for 24 hours related to this e-Visit. If  it has been greater than 24 hours you will need to follow up with your provider, or enter a new e-Visit to address those concerns. You will get an e-mail in the next two days asking about your experience.  I hope that your e-visit has been valuable and will speed your recovery. Thank you for using e-visits.  5-10 minutes spent reviewing and documenting in chart.

## 2019-12-04 ENCOUNTER — Telehealth: Payer: 59 | Admitting: Physician Assistant

## 2019-12-04 ENCOUNTER — Other Ambulatory Visit: Payer: Self-pay | Admitting: Physician Assistant

## 2019-12-04 DIAGNOSIS — M5442 Lumbago with sciatica, left side: Secondary | ICD-10-CM

## 2019-12-04 MED ORDER — CYCLOBENZAPRINE HCL 10 MG PO TABS: 10.0000 mg | ORAL_TABLET | Freq: Three times a day (TID) | ORAL | 0 refills | Status: DC | PRN

## 2019-12-04 MED ORDER — PREDNISONE 20 MG PO TABS: 40.0000 mg | ORAL_TABLET | Freq: Every day | ORAL | 0 refills | Status: DC

## 2019-12-04 NOTE — Progress Notes (Signed)
We are sorry that you are not feeling well.  Here is how we plan to help!  Based on what you have shared with me it looks like you mostly have acute back pain.  Acute back pain is defined as musculoskeletal pain that can resolve in 1-3 weeks with conservative treatment.  I have prescribed prednisone 20 mg twice daily x 5 days and prescription for 10 mg flexeril as needed. You may also take over the counter ibuprofen as instructed on the package. Some patients experience stomach irritation or in increased heartburn with anti-inflammatory drugs.  Please keep in mind that muscle relaxer's can cause fatigue and should not be taken while at work or driving.  Back pain is very common.  The pain often gets better over time.  The cause of back pain is usually not dangerous.  Most people can learn to manage their back pain on their own.  Home Care  Stay active.  Start with short walks on flat ground if you can.  Try to walk farther each day.  Do not sit, drive or stand in one place for more than 30 minutes.  Do not stay in bed.  Do not avoid exercise or work.  Activity can help your back heal faster.  Be careful when you bend or lift an object.  Bend at your knees, keep the object close to you, and do not twist.  Sleep on a firm mattress.  Lie on your side, and bend your knees.  If you lie on your back, put a pillow under your knees.  Only take medicines as told by your doctor.  Put ice on the injured area.  Put ice in a plastic bag  Place a towel between your skin and the bag  Leave the ice on for 15-20 minutes, 3-4 times a day for the first 2-3 days. 210 After that, you can switch between ice and heat packs.  Ask your doctor about back exercises or massage.  Avoid feeling anxious or stressed.  Find good ways to deal with stress, such as exercise.  Get Help Right Way If:  Your pain does not go away with rest or medicine.  Your pain does not go away in 1 week.  You have new  problems.  You do not feel well.  The pain spreads into your legs.  You cannot control when you poop (bowel movement) or pee (urinate)  You feel sick to your stomach (nauseous) or throw up (vomit)  You have belly (abdominal) pain.  You feel like you may pass out (faint).  If you develop a fever.  Make Sure you:  Understand these instructions.  Will watch your condition  Will get help right away if you are not doing well or get worse.  Your e-visit answers were reviewed by a board certified advanced clinical practitioner to complete your personal care plan.  Depending on the condition, your plan could have included both over the counter or prescription medications.  If there is a problem please reply  once you have received a response from your provider.  Your safety is important to [place].  If you have drug allergies check your prescription carefully.    You can use MyChart to ask questions about today's visit, request a non-urgent call back, or ask for a work or school excuse for 24 hours related to this e-Visit. If it has been greater than 24 hours you will need to follow up with your provider, or enter a new  e-Visit to address those concerns.  You will get an e-mail in the next two days asking about your experience.  I hope that your e-visit has been valuable and will speed your recovery. Thank you for using e-visits.  Greater than 5 minutes, yet less than 10 minutes of time have been spent researching, coordinating, and implementing care for this patient today.  Jarold Motto PA-C

## 2019-12-20 DIAGNOSIS — N939 Abnormal uterine and vaginal bleeding, unspecified: Secondary | ICD-10-CM | POA: Diagnosis not present

## 2019-12-20 DIAGNOSIS — N920 Excessive and frequent menstruation with regular cycle: Secondary | ICD-10-CM | POA: Diagnosis not present

## 2020-01-07 ENCOUNTER — Other Ambulatory Visit: Payer: Self-pay | Admitting: Unknown Physician Specialty

## 2020-01-07 ENCOUNTER — Ambulatory Visit (HOSPITAL_COMMUNITY)
Admission: RE | Admit: 2020-01-07 | Discharge: 2020-01-07 | Disposition: A | Discharge status: Home / Self Care | Payer: 59 | Source: Ambulatory Visit | Attending: Pulmonary Disease | Admitting: Pulmonary Disease

## 2020-01-07 DIAGNOSIS — U071 COVID-19: Secondary | ICD-10-CM

## 2020-01-07 DIAGNOSIS — E663 Overweight: Secondary | ICD-10-CM

## 2020-01-07 DIAGNOSIS — Z6829 Body mass index (BMI) 29.0-29.9, adult: Secondary | ICD-10-CM | POA: Insufficient documentation

## 2020-01-07 MED ORDER — ALBUTEROL SULFATE HFA 108 (90 BASE) MCG/ACT IN AERS: 2.0000 | INHALATION_SPRAY | Freq: Once | RESPIRATORY_TRACT | Status: DC | PRN

## 2020-01-07 MED ORDER — SODIUM CHLORIDE 0.9 % IV SOLN: INTRAVENOUS | Status: DC | PRN

## 2020-01-07 MED ORDER — EPINEPHRINE 0.3 MG/0.3ML IJ SOAJ: 0.3000 mg | Freq: Once | INTRAMUSCULAR | Status: DC | PRN

## 2020-01-07 MED ORDER — SOTROVIMAB 500 MG/8ML IV SOLN
500.0000 mg | Freq: Once | INTRAVENOUS | Status: AC
  Administered 2020-01-07: 500 mg via INTRAVENOUS

## 2020-01-07 MED ORDER — DIPHENHYDRAMINE HCL 50 MG/ML IJ SOLN: 50.0000 mg | Freq: Once | INTRAMUSCULAR | Status: DC | PRN

## 2020-01-07 MED ORDER — METHYLPREDNISOLONE SODIUM SUCC 125 MG IJ SOLR: 125.0000 mg | Freq: Once | INTRAMUSCULAR | Status: DC | PRN

## 2020-01-07 MED ORDER — FAMOTIDINE IN NACL 20-0.9 MG/50ML-% IV SOLN: 20.0000 mg | Freq: Once | INTRAVENOUS | Status: DC | PRN

## 2020-01-07 NOTE — Progress Notes (Signed)
Patient reviewed Fact Sheet for Patients, Parents, and Caregivers for Emergency Use Authorization (EUA) of Sotrovimab for the Treatment of Coronavirus. Patient also reviewed and is agreeable to the estimated cost of treatment. Patient is agreeable to proceed.   

## 2020-01-07 NOTE — Discharge Instructions (Signed)

## 2020-01-07 NOTE — Progress Notes (Addendum)
  Diagnosis: COVID-19  Physician: Delford Field, MD  Procedure: Sotrobimab  Complications: No immediate complications noted.  Discharge: Discharged home   Robb Matar 01/07/2020

## 2020-01-09 ENCOUNTER — Other Ambulatory Visit (HOSPITAL_COMMUNITY): Payer: Self-pay

## 2020-03-19 ENCOUNTER — Inpatient Hospital Stay: Payer: 59 | Attending: Oncology

## 2020-03-19 ENCOUNTER — Other Ambulatory Visit: Payer: Self-pay | Admitting: Oncology

## 2020-03-19 DIAGNOSIS — R002 Palpitations: Secondary | ICD-10-CM | POA: Diagnosis not present

## 2020-03-19 DIAGNOSIS — R5382 Chronic fatigue, unspecified: Secondary | ICD-10-CM | POA: Diagnosis not present

## 2020-03-19 LAB — CBC WITH DIFFERENTIAL/PLATELET
Abs Immature Granulocytes: 0.01 10*3/uL (ref 0.00–0.07)
Basophils Absolute: 0 10*3/uL (ref 0.0–0.1)
Basophils Relative: 1 %
Eosinophils Absolute: 0.1 10*3/uL (ref 0.0–0.5)
Eosinophils Relative: 2 %
HCT: 39.2 % (ref 36.0–46.0)
Hemoglobin: 13.5 g/dL (ref 12.0–15.0)
Immature Granulocytes: 0 %
Lymphocytes Relative: 50 %
Lymphs Abs: 3.5 10*3/uL (ref 0.7–4.0)
MCH: 31.8 pg (ref 26.0–34.0)
MCHC: 34.4 g/dL (ref 30.0–36.0)
MCV: 92.5 fL (ref 80.0–100.0)
Monocytes Absolute: 0.3 10*3/uL (ref 0.1–1.0)
Monocytes Relative: 4 %
Neutro Abs: 3.1 10*3/uL (ref 1.7–7.7)
Neutrophils Relative %: 43 %
Platelets: 294 10*3/uL (ref 150–400)
RBC: 4.24 MIL/uL (ref 3.87–5.11)
RDW: 12 % (ref 11.5–15.5)
WBC: 7.1 10*3/uL (ref 4.0–10.5)
nRBC: 0 % (ref 0.0–0.2)

## 2020-03-19 LAB — COMPREHENSIVE METABOLIC PANEL
ALT: 12 U/L (ref 0–44)
AST: 17 U/L (ref 15–41)
Albumin: 4.3 g/dL (ref 3.5–5.0)
Alkaline Phosphatase: 57 U/L (ref 38–126)
Anion gap: 7 (ref 5–15)
BUN: 16 mg/dL (ref 6–20)
CO2: 27 mmol/L (ref 22–32)
Calcium: 8.8 mg/dL — ABNORMAL LOW (ref 8.9–10.3)
Chloride: 97 mmol/L — ABNORMAL LOW (ref 98–111)
Creatinine, Ser: 0.62 mg/dL (ref 0.44–1.00)
GFR, Estimated: >60 mL/min (ref >60)
Glucose, Bld: 101 mg/dL — ABNORMAL HIGH (ref 70–99)
Potassium: 3.9 mmol/L (ref 3.5–5.1)
Sodium: 131 mmol/L — ABNORMAL LOW (ref 135–145)
Total Bilirubin: 0.4 mg/dL (ref 0.3–1.2)
Total Protein: 8.1 g/dL (ref 6.5–8.1)

## 2020-03-19 LAB — IRON AND TIBC
Iron: 72 ug/dL (ref 28–170)
Saturation Ratios: 19 % (ref 10.4–31.8)
TIBC: 378 ug/dL (ref 250–450)
UIBC: 306 ug/dL

## 2020-03-19 LAB — VITAMIN B12: Vitamin B-12: 423 pg/mL (ref 180–914)

## 2020-03-19 LAB — TSH: TSH: 5.145 u[IU]/mL — ABNORMAL HIGH (ref 0.350–4.500)

## 2020-03-19 LAB — VITAMIN D 25 HYDROXY (VIT D DEFICIENCY, FRACTURES): Vit D, 25-Hydroxy: 18.42 ng/mL — ABNORMAL LOW (ref 30–100)

## 2020-03-19 LAB — FERRITIN: Ferritin: 18 ng/mL (ref 11–307)

## 2020-03-21 ENCOUNTER — Inpatient Hospital Stay: Payer: 59 | Attending: Oncology | Admitting: Oncology

## 2020-03-21 ENCOUNTER — Other Ambulatory Visit: Payer: Self-pay

## 2020-03-21 ENCOUNTER — Inpatient Hospital Stay: Payer: 59

## 2020-03-21 ENCOUNTER — Other Ambulatory Visit: Payer: Self-pay | Admitting: Oncology

## 2020-03-21 DIAGNOSIS — R5382 Chronic fatigue, unspecified: Secondary | ICD-10-CM

## 2020-03-21 DIAGNOSIS — R7989 Other specified abnormal findings of blood chemistry: Secondary | ICD-10-CM

## 2020-03-21 DIAGNOSIS — Z811 Family history of alcohol abuse and dependence: Secondary | ICD-10-CM

## 2020-03-21 DIAGNOSIS — Z833 Family history of diabetes mellitus: Secondary | ICD-10-CM | POA: Diagnosis not present

## 2020-03-21 DIAGNOSIS — R5383 Other fatigue: Secondary | ICD-10-CM | POA: Diagnosis not present

## 2020-03-21 DIAGNOSIS — R531 Weakness: Secondary | ICD-10-CM

## 2020-03-21 DIAGNOSIS — Z79899 Other long term (current) drug therapy: Secondary | ICD-10-CM | POA: Diagnosis not present

## 2020-03-21 DIAGNOSIS — D509 Iron deficiency anemia, unspecified: Secondary | ICD-10-CM

## 2020-03-21 DIAGNOSIS — Z8261 Family history of arthritis: Secondary | ICD-10-CM | POA: Diagnosis not present

## 2020-03-21 DIAGNOSIS — R002 Palpitations: Secondary | ICD-10-CM

## 2020-03-21 DIAGNOSIS — Z8249 Family history of ischemic heart disease and other diseases of the circulatory system: Secondary | ICD-10-CM

## 2020-03-21 DIAGNOSIS — Z809 Family history of malignant neoplasm, unspecified: Secondary | ICD-10-CM | POA: Diagnosis not present

## 2020-03-21 DIAGNOSIS — E871 Hypo-osmolality and hyponatremia: Secondary | ICD-10-CM

## 2020-03-21 DIAGNOSIS — E663 Overweight: Secondary | ICD-10-CM | POA: Diagnosis not present

## 2020-03-21 DIAGNOSIS — Z8349 Family history of other endocrine, nutritional and metabolic diseases: Secondary | ICD-10-CM | POA: Diagnosis not present

## 2020-03-21 DIAGNOSIS — Z836 Family history of other diseases of the respiratory system: Secondary | ICD-10-CM | POA: Diagnosis not present

## 2020-03-21 DIAGNOSIS — E559 Vitamin D deficiency, unspecified: Secondary | ICD-10-CM

## 2020-03-21 DIAGNOSIS — R946 Abnormal results of thyroid function studies: Secondary | ICD-10-CM | POA: Diagnosis not present

## 2020-03-21 LAB — MAGNESIUM: Magnesium: 2.3 mg/dL (ref 1.7–2.4)

## 2020-03-21 LAB — COPPER, SERUM: Copper: 114 ug/dL (ref 80–158)

## 2020-03-21 LAB — SODIUM: Sodium: 135 mmol/L (ref 135–145)

## 2020-03-21 NOTE — Progress Notes (Signed)
CONSULT NOTE  Patient Care Team: Theadore NanMills, Kimberly A, NP as PCP - General (Nurse Practitioner)  CHIEF COMPLAINTS/PURPOSE OF CONSULTATION: IDA  HISTORY OF PRESENTING ILLNESS:  Kristin Sellers 35 y.o. female is here because of iron deficiency anemia. She was self referred for symptoms of anemia inlcuding weakness, fatigue and palpitations.  Kristin Sellers is a 35 year old female with past medical history significant for vitamin D deficiency, obesity, hyperlipidemia and heart palpitations.  She has had intermittent anemia since 2014.  Hemoglobin ranges from 10-13.  She denies any chest pain on exertion, shortness of breath or presyncopal episodes.  She admits to palpitations daily.  She has not noticed any bleeding such as epistasis, hematuria or hematochezia.  Does admit to heavy menstrual cycles and increased frequency over the past few months.  She is scheduled to follow-up with gynecology in the next couple weeks to have the IUD placed.  She is status post tubal ligation in 2018.  Denies OTC NSAID ingestion.  She is not on antiplatelet agents.  She has not had a colonoscopy in the past.  She has no prior history of cancer.  Has age-appropriate screenings.  Denies peak and eats a variety of food in her diet.  She has never donated nor received blood.  She is currently not taking iron supplements.  MEDICAL HISTORY:  Past Medical History:  Diagnosis Date  . Allergy   . Asthma    as a child - no problems as adult - no inhaler  . Bronchitis 10/2015   tx with proair inhaler  . Heart murmur    no problems ever  . History of Bell's palsy   . History of pre-eclampsia   . Hyperlipidemia    diet controlled - no med  . Palpitations    hx with prior pregnancy  . Pregnancy induced hypertension    Hx GHTN - resolved after 03/2016 delivery  . SVD (spontaneous vaginal delivery)    x 3    SURGICAL HISTORY: Past Surgical History:  Procedure Laterality Date  . LAPAROSCOPIC BILATERAL SALPINGECTOMY  Bilateral 06/12/2016   Procedure: LAPAROSCOPIC BILATERAL SALPINGECTOMY;  Surgeon: Waynard ReedsKendra Ross, MD;  Location: [facility];  Service: Gynecology;  Laterality: Bilateral;  . MOUTH SURGERY     wisdom teeth ext  . tubes in ears     as a child    SOCIAL HISTORY: Social History   Socioeconomic History  . Marital status: Married    Spouse name: Not on file  . Number of children: 2  . Years of education: Not on file  . Highest education level: Not on file  Occupational History  . Occupation: Charity fundraiserN    Comment: [employer]    Employer: [employer]  Tobacco Use  . Smoking status: Never Smoker  . Smokeless tobacco: Never Used  Substance and Sexual Activity  . Alcohol use: No  . Drug use: No  . Sexual activity: Yes    Birth control/protection: None  Other Topics Concern  . Not on file  Social History Narrative  . Not on file   Social Determinants of Health   Financial Resource Strain: Not on file  Food Insecurity: Not on file  Transportation Needs: Not on file  Physical Activity: Not on file  Stress: Not on file  Social Connections: Not on file  Intimate Partner Violence: Not on file    FAMILY HISTORY: Family History  Problem Relation Age of Onset  . Heart disease Mother   .  Hyperlipidemia Mother   . Hypertension Mother   . Heart disease Father   . Hyperlipidemia Father   . Hypertension Father   . Coronary artery disease Father   . Heart attack Father   . Alcohol abuse Father   . Arthritis Father   . Cancer Father   . COPD Father   . Diabetes Father   . Early death Paternal Grandfather   . Coronary artery disease Other   . Hypertension Other   . Heart attack Other   . Asthma Sister   . Asthma Brother   . Asthma Son   . Cancer Maternal Grandmother   . Diabetes Maternal Grandmother   . Heart disease Sister   . Asthma Son   . Heart disease Paternal Grandmother   . Hyperlipidemia Paternal Grandmother   . Hypertension Paternal Grandmother    . Anesthesia problems Neg Hx   . Hypotension Neg Hx   . Malignant hyperthermia Neg Hx   . Pseudochol deficiency Neg Hx     ALLERGIES:  is allergic to sulfa antibiotics.  MEDICATIONS:  Current Outpatient Medications  Medication Sig Dispense Refill  . azithromycin (ZITHROMAX Z-PAK) 250 MG tablet As directed 6 tablet 0  . benzonatate (TESSALON) 100 MG capsule Take 1 capsule (100 mg total) by mouth every 8 (eight) hours. 21 capsule 0  . butalbital-acetaminophen-caffeine (FIORICET) 50-325-40 MG tablet Take 1 tablet by mouth every 6 (six) hours as needed for headache. 20 tablet 0  . cephALEXin (KEFLEX) 500 MG capsule 1 cap po bid x 7 days 14 capsule 0  . cyclobenzaprine (FLEXERIL) 10 MG tablet Take 1 tablet (10 mg total) by mouth 3 (three) times daily as needed for muscle spasms. 20 tablet 0  . metoprolol tartrate (LOPRESSOR) 25 MG tablet Take 0.5 tablets (12.5 mg total) by mouth 2 (two) times daily as needed (Palpitations). 90 tablet 0  . predniSONE (DELTASONE) 20 MG tablet Take 2 tablets (40 mg total) by mouth daily. 10 tablet 0   No current facility-administered medications for this visit.    REVIEW OF SYSTEMS:   Constitutional: Denies fevers, chills or abnormal night sweats Eyes: Denies blurriness of vision, double vision or watery eyes Ears, nose, mouth, throat, and face: Denies mucositis or sore throat Respiratory: Denies cough, dyspnea or wheezes Cardiovascular: Denies palpitation, chest discomfort or lower extremity swelling Gastrointestinal:  Denies nausea, heartburn or change in bowel habits Skin: Denies abnormal skin rashes Lymphatics: Denies new lymphadenopathy or easy bruising Neurological:Denies numbness, tingling or new weaknesses Behavioral/Psych: Mood is stable, no new changes  All other systems were reviewed with the patient and are negative.  PHYSICAL EXAMINATION: ECOG PERFORMANCE STATUS: 0 - Asymptomatic  There were no vitals filed for this visit. There were no  vitals filed for this visit.  GENERAL:alert, no distress and comfortable SKIN: skin color, texture, turgor are normal, no rashes or significant lesions EYES: normal, conjunctiva are pink and non-injected, sclera clear OROPHARYNX:no exudate, no erythema and lips, buccal mucosa, and tongue normal  NECK: supple, thyroid normal size, non-tender, without nodularity LYMPH:  no palpable lymphadenopathy in the cervical, axillary or inguinal LUNGS: clear to auscultation and percussion with normal breathing effort HEART: regular rate & rhythm and no murmurs and no lower extremity edema ABDOMEN:abdomen soft, non-tender and normal bowel sounds Musculoskeletal:no cyanosis of digits and no clubbing  PSYCH: alert & oriented x 3 with fluent speech NEURO: no focal motor/sensory deficits  LABORATORY DATA:  I have reviewed the data as listed Recent Results (from  the past 2160 hour(s))  CBC with Differential/Platelet     Status: None   Collection Time: 03/19/20  3:08 PM  Result Value Ref Range   WBC 7.1 4.0 - 10.5 K/uL   RBC 4.24 3.87 - 5.11 MIL/uL   Hemoglobin 13.5 12.0 - 15.0 g/dL   HCT 72.0 94.7 - 09.6 %   MCV 92.5 80.0 - 100.0 fL   MCH 31.8 26.0 - 34.0 pg   MCHC 34.4 30.0 - 36.0 g/dL   RDW 28.3 66.2 - 94.7 %   Platelets 294 150 - 400 K/uL   nRBC 0.0 0.0 - 0.2 %   Neutrophils Relative % 43 %   Neutro Abs 3.1 1.7 - 7.7 K/uL   Lymphocytes Relative 50 %   Lymphs Abs 3.5 0.7 - 4.0 K/uL   Monocytes Relative 4 %   Monocytes Absolute 0.3 0.1 - 1.0 K/uL   Eosinophils Relative 2 %   Eosinophils Absolute 0.1 0.0 - 0.5 K/uL   Basophils Relative 1 %   Basophils Absolute 0.0 0.0 - 0.1 K/uL   Immature Granulocytes 0 %   Abs Immature Granulocytes 0.01 0.00 - 0.07 K/uL    Comment: Performed at [facility], [address] [place], [place] [address]  Comprehensive metabolic panel     Status: Abnormal   Collection Time: 03/19/20  3:08 PM  Result Value Ref Range   Sodium 131 (L) 135 - 145 mmol/L    Potassium 3.9 3.5 - 5.1 mmol/L   Chloride 97 (L) 98 - 111 mmol/L   CO2 27 22 - 32 mmol/L   Glucose, Bld 101 (H) 70 - 99 mg/dL    Comment: Glucose reference range applies only to samples taken after fasting for at least 8 hours.   BUN 16 6 - 20 mg/dL   Creatinine, Ser 0.35 0.44 - 1.00 mg/dL   Calcium 8.8 (L) 8.9 - 10.3 mg/dL   Total Protein 8.1 6.5 - 8.1 g/dL   Albumin 4.3 3.5 - 5.0 g/dL   AST 17 15 - 41 U/L   ALT 12 0 - 44 U/L   Alkaline Phosphatase 57 38 - 126 U/L   Total Bilirubin 0.4 0.3 - 1.2 mg/dL   GFR, Estimated >46 >56 mL/min    Comment: (NOTE) Calculated using the CKD-EPI Creatinine Equation (2021)    Anion gap 7 5 - 15    Comment: Performed at [facility], [address] [place], [place] [address]  Copper, serum     Status: None   Collection Time: 03/19/20  3:08 PM  Result Value Ref Range   Copper 114 80 - 158 ug/dL    Comment: (NOTE) This test was developed and its performance characteristics determined by [facility]. It has not been cleared or approved by the Food and Drug Administration.                                Detection Limit = 5 Performed At: [facility] [address] [place], [place] [id number] Jolene Schimke MD [facility]:[id number]   Vitamin D 25 hydroxy     Status: Abnormal   Collection Time: 03/19/20  3:08 PM  Result Value Ref Range   Vit D, 25-Hydroxy 18.42 (L) 30 - 100 ng/mL    Comment: (NOTE) Vitamin D deficiency has been defined by the Institute of Medicine  and an Endocrine Society practice guideline as a level of serum 25-OH  vitamin D  less than 20 ng/mL (1,2). The Endocrine Society went on to  further define vitamin D insufficiency as a level between 21 and 29  ng/mL (2).  1. IOM (Institute of Medicine). 2010. Dietary reference intakes for  calcium and D. [place]: [employer]. 2. Holick MF, Bink[place], Bischoff-Ferrari HA, et al. Evaluation,  treatment, and prevention of vitamin D deficiency: an  Endocrine  Society clinical practice guideline, JCEM. 2011 Jul; 96(7): 1911-30.  Performed at [facility], [address] [place], [place] [address]   Vitamin B12     Status: None   Collection Time: 03/19/20  3:08 PM  Result Value Ref Range   Vitamin B-12 423 180 - 914 pg/mL    Comment: (NOTE) This assay is not validated for testing neonatal or myeloproliferative syndrome specimens for Vitamin B12 levels. Performed at [facility], [address] [place], [place] [address]   Ferritin     Status: None   Collection Time: 03/19/20  3:08 PM  Result Value Ref Range   Ferritin 18 11 - 307 ng/mL    Comment: Performed at [facility], [address] [place], [place] [address]  Iron and TIBC     Status: None   Collection Time: 03/19/20  3:08 PM  Result Value Ref Range   Iron 72 28 - 170 ug/dL   TIBC 892 119 - 417 ug/dL   Saturation Ratios 19 10.4 - 31.8 %   UIBC 306 ug/dL    Comment: Performed at [facility], [address] [place], [place] [address]  TSH     Status: Abnormal   Collection Time: 03/19/20  3:08 PM  Result Value Ref Range   TSH 5.145 (H) 0.350 - 4.500 uIU/mL    Comment: Performed by a 3rd Generation assay with a functional sensitivity of <=0.01 uIU/mL. Performed at [facility], [address] [place], [place] [address]     RADIOGRAPHIC STUDIES: I have personally reviewed the radiological images as listed and agreed with the findings in the report. No results found.  ASSESSMENT & PLAN:   Iron deficiency anemia: -Hemoglobin has ranged from 10-13 since 2014. -Symptomatic with significant fatigue and palpitations. -Currently having heavy menstrual cycles averaging twice per month. -Recheck labs today including CBC, CMP, vitamin deficiencies (B12, copper, MMA, iron and ferritin), and TSH given she is having palpitations.  -Vitamin B12, copper and MMA are all normal.  -CBC is unremarkable.  Iron panel and ferritin are  on the low end of normal.   Palpitations: -She has had work-up in the past and been seen in the emergency room. -Work-up included a normal EKG.  She was placed on metoprolol 25 mg twice daily as needed for palpitations. -She continues to have these intermittently. -We will check TSH today.  -TSH elevated at 5.145.  We will check T4 and T3.  Vitamin D deficiency: -Has had previously low vitamin D levels. -Recheck vitamin D today. -Vitamin D on 03/19/2020 is 18.42. -Recommend supplementation with OTC vitamin D and calcium.  We will recheck labs in 6 weeks.  Hyponatremia: -Sodium is 131 today. -Recommend oral hydration we will recheck in 6 weeks.  Disposition: -We will call patient with results.  Addendum: -Labs show a normal hemoglobin but low-normal ferritin and iron levels.  Given she is symptomatic and having heavy menstrual cycles, would recommend oral or IV iron.  Discussed with patient who would prefer IV iron.  Has tried oral iron in  the past and developed GI symptoms.  Vitamins appear within normal limits.  TSH is slightly elevated at 5.145.  We will add on free T4 and T3 for completeness.  -We will get her set up for IV iron in the next week or so.  No problem-specific Assessment & Plan notes found for this encounter.  All questions were answered. The patient knows to call the clinic with any problems, questions or concerns.  Greater than 50% was spent in counseling and coordination of care with this patient including but not limited to discussion of the relevant topics above (See A&P) including, but not limited to diagnosis and management of acute and chronic medical conditions.      Mauro Kaufmann, NP 03/21/20 11:12 AM

## 2020-03-22 LAB — T4: T4, Total: 5.8 ug/dL (ref 4.5–12.0)

## 2020-03-22 LAB — T3, FREE: T3, Free: 2.7 pg/mL (ref 2.0–4.4)

## 2020-03-23 ENCOUNTER — Other Ambulatory Visit: Payer: Self-pay | Admitting: Oncology

## 2020-03-23 DIAGNOSIS — D500 Iron deficiency anemia secondary to blood loss (chronic): Secondary | ICD-10-CM

## 2020-03-23 DIAGNOSIS — D509 Iron deficiency anemia, unspecified: Secondary | ICD-10-CM | POA: Insufficient documentation

## 2020-03-28 ENCOUNTER — Inpatient Hospital Stay: Payer: 59

## 2020-03-28 VITALS — BP 132/88 | HR 105

## 2020-03-28 DIAGNOSIS — R5383 Other fatigue: Secondary | ICD-10-CM | POA: Diagnosis not present

## 2020-03-28 DIAGNOSIS — R946 Abnormal results of thyroid function studies: Secondary | ICD-10-CM | POA: Diagnosis not present

## 2020-03-28 DIAGNOSIS — E559 Vitamin D deficiency, unspecified: Secondary | ICD-10-CM | POA: Diagnosis not present

## 2020-03-28 DIAGNOSIS — R531 Weakness: Secondary | ICD-10-CM | POA: Diagnosis not present

## 2020-03-28 DIAGNOSIS — D500 Iron deficiency anemia secondary to blood loss (chronic): Secondary | ICD-10-CM

## 2020-03-28 DIAGNOSIS — E663 Overweight: Secondary | ICD-10-CM | POA: Diagnosis not present

## 2020-03-28 DIAGNOSIS — R002 Palpitations: Secondary | ICD-10-CM | POA: Diagnosis not present

## 2020-03-28 DIAGNOSIS — D509 Iron deficiency anemia, unspecified: Secondary | ICD-10-CM | POA: Diagnosis not present

## 2020-03-28 DIAGNOSIS — Z79899 Other long term (current) drug therapy: Secondary | ICD-10-CM | POA: Diagnosis not present

## 2020-03-28 DIAGNOSIS — E871 Hypo-osmolality and hyponatremia: Secondary | ICD-10-CM | POA: Diagnosis not present

## 2020-03-28 MED ORDER — SODIUM CHLORIDE 0.9 % IV SOLN
Freq: Once | INTRAVENOUS | Status: AC
  Filled 2020-03-28: qty 250

## 2020-03-28 MED ORDER — SODIUM CHLORIDE 0.9 % IV SOLN: 200.0000 mg | Freq: Once | INTRAVENOUS | Status: DC

## 2020-03-28 MED ORDER — IRON SUCROSE 20 MG/ML IV SOLN
200.0000 mg | Freq: Once | INTRAVENOUS | Status: AC
  Administered 2020-03-28: 200 mg via INTRAVENOUS
  Filled 2020-03-28: qty 10

## 2020-04-04 ENCOUNTER — Inpatient Hospital Stay: Payer: 59

## 2020-04-04 VITALS — BP 106/72 | HR 90 | Temp 96.9°F | Resp 16

## 2020-04-04 DIAGNOSIS — Z79899 Other long term (current) drug therapy: Secondary | ICD-10-CM | POA: Diagnosis not present

## 2020-04-04 DIAGNOSIS — D509 Iron deficiency anemia, unspecified: Secondary | ICD-10-CM | POA: Diagnosis not present

## 2020-04-04 DIAGNOSIS — R002 Palpitations: Secondary | ICD-10-CM | POA: Diagnosis not present

## 2020-04-04 DIAGNOSIS — R946 Abnormal results of thyroid function studies: Secondary | ICD-10-CM | POA: Diagnosis not present

## 2020-04-04 DIAGNOSIS — R5383 Other fatigue: Secondary | ICD-10-CM | POA: Diagnosis not present

## 2020-04-04 DIAGNOSIS — E663 Overweight: Secondary | ICD-10-CM | POA: Diagnosis not present

## 2020-04-04 DIAGNOSIS — R531 Weakness: Secondary | ICD-10-CM | POA: Diagnosis not present

## 2020-04-04 DIAGNOSIS — D500 Iron deficiency anemia secondary to blood loss (chronic): Secondary | ICD-10-CM

## 2020-04-04 DIAGNOSIS — E871 Hypo-osmolality and hyponatremia: Secondary | ICD-10-CM | POA: Diagnosis not present

## 2020-04-04 DIAGNOSIS — E559 Vitamin D deficiency, unspecified: Secondary | ICD-10-CM | POA: Diagnosis not present

## 2020-04-04 MED ORDER — SODIUM CHLORIDE 0.9 % IV SOLN: 200.0000 mg | Freq: Once | INTRAVENOUS | Status: DC

## 2020-04-04 MED ORDER — IRON SUCROSE 20 MG/ML IV SOLN
200.0000 mg | Freq: Once | INTRAVENOUS | Status: AC
  Administered 2020-04-04: 200 mg via INTRAVENOUS
  Filled 2020-04-04: qty 10

## 2020-04-04 MED ORDER — SODIUM CHLORIDE 0.9 % IV SOLN
Freq: Once | INTRAVENOUS | Status: AC
  Filled 2020-04-04: qty 250

## 2020-04-05 DIAGNOSIS — Z30430 Encounter for insertion of intrauterine contraceptive device: Secondary | ICD-10-CM | POA: Diagnosis not present

## 2020-05-02 ENCOUNTER — Inpatient Hospital Stay: Payer: 59

## 2020-05-03 ENCOUNTER — Other Ambulatory Visit: Payer: Self-pay

## 2020-05-03 ENCOUNTER — Inpatient Hospital Stay: Payer: 59 | Attending: Oncology

## 2020-05-03 DIAGNOSIS — R002 Palpitations: Secondary | ICD-10-CM | POA: Insufficient documentation

## 2020-05-03 DIAGNOSIS — R7989 Other specified abnormal findings of blood chemistry: Secondary | ICD-10-CM | POA: Diagnosis not present

## 2020-05-03 DIAGNOSIS — R5382 Chronic fatigue, unspecified: Secondary | ICD-10-CM

## 2020-05-03 LAB — CBC WITH DIFFERENTIAL/PLATELET
Abs Immature Granulocytes: 0.01 10*3/uL (ref 0.00–0.07)
Basophils Absolute: 0 10*3/uL (ref 0.0–0.1)
Basophils Relative: 1 %
Eosinophils Absolute: 0.1 10*3/uL (ref 0.0–0.5)
Eosinophils Relative: 2 %
HCT: 39.2 % (ref 36.0–46.0)
Hemoglobin: 13.3 g/dL (ref 12.0–15.0)
Immature Granulocytes: 0 %
Lymphocytes Relative: 48 %
Lymphs Abs: 3 10*3/uL (ref 0.7–4.0)
MCH: 31.5 pg (ref 26.0–34.0)
MCHC: 33.9 g/dL (ref 30.0–36.0)
MCV: 92.9 fL (ref 80.0–100.0)
Monocytes Absolute: 0.4 10*3/uL (ref 0.1–1.0)
Monocytes Relative: 6 %
Neutro Abs: 2.6 10*3/uL (ref 1.7–7.7)
Neutrophils Relative %: 43 %
Platelets: 269 10*3/uL (ref 150–400)
RBC: 4.22 MIL/uL (ref 3.87–5.11)
RDW: 12.7 % (ref 11.5–15.5)
WBC: 6.1 10*3/uL (ref 4.0–10.5)
nRBC: 0 % (ref 0.0–0.2)

## 2020-05-04 LAB — THYROID PANEL WITH TSH
Free Thyroxine Index: 1.6 (ref 1.2–4.9)
T3 Uptake Ratio: 24 % (ref 24–39)
T4, Total: 6.7 ug/dL (ref 4.5–12.0)
TSH: 3.66 u[IU]/mL (ref 0.450–4.500)

## 2020-05-29 ENCOUNTER — Telehealth: Payer: 59 | Admitting: Emergency Medicine

## 2020-05-29 DIAGNOSIS — J019 Acute sinusitis, unspecified: Secondary | ICD-10-CM | POA: Diagnosis not present

## 2020-05-29 DIAGNOSIS — B9689 Other specified bacterial agents as the cause of diseases classified elsewhere: Secondary | ICD-10-CM | POA: Diagnosis not present

## 2020-05-29 MED ORDER — AMOXICILLIN-POT CLAVULANATE 875-125 MG PO TABS: 1.0000 | ORAL_TABLET | Freq: Two times a day (BID) | ORAL | 0 refills | Status: DC

## 2020-05-29 NOTE — Progress Notes (Signed)

## 2020-06-04 DIAGNOSIS — Z30431 Encounter for routine checking of intrauterine contraceptive device: Secondary | ICD-10-CM | POA: Diagnosis not present

## 2020-06-19 ENCOUNTER — Other Ambulatory Visit: Payer: Self-pay

## 2020-06-19 ENCOUNTER — Telehealth: Payer: 59 | Admitting: Physician Assistant

## 2020-06-19 DIAGNOSIS — R059 Cough, unspecified: Secondary | ICD-10-CM | POA: Diagnosis not present

## 2020-06-19 DIAGNOSIS — E785 Hyperlipidemia, unspecified: Secondary | ICD-10-CM | POA: Insufficient documentation

## 2020-06-19 MED ORDER — FLUTICASONE PROPIONATE 50 MCG/ACT NA SUSP
2.0000 | Freq: Every day | NASAL | 0 refills | Status: DC
  Filled 2020-06-19: qty 16, 30d supply, fill #0

## 2020-06-19 MED ORDER — ALBUTEROL SULFATE HFA 108 (90 BASE) MCG/ACT IN AERS
2.0000 | INHALATION_SPRAY | RESPIRATORY_TRACT | 0 refills | Status: DC | PRN
  Filled 2020-06-19: qty 8.5, 8d supply, fill #0

## 2020-06-19 MED ORDER — PREDNISONE 20 MG PO TABS
40.0000 mg | ORAL_TABLET | Freq: Every day | ORAL | 0 refills | Status: DC
  Filled 2020-06-19: qty 10, 5d supply, fill #0

## 2020-06-19 MED ORDER — MONTELUKAST SODIUM 10 MG PO TABS
10.0000 mg | ORAL_TABLET | Freq: Every day | ORAL | 0 refills | Status: DC
  Filled 2020-06-19: qty 30, 30d supply, fill #0

## 2020-06-19 MED ORDER — AEROCHAMBER PLUS FLO-VU MEDIUM MISC
1.0000 | Freq: Once | 0 refills | Status: AC
  Filled 2020-06-19: qty 1, 1d supply, fill #0

## 2020-06-19 MED ORDER — BENZONATATE 100 MG PO CAPS
100.0000 mg | ORAL_CAPSULE | Freq: Three times a day (TID) | ORAL | 0 refills | Status: DC | PRN
  Filled 2020-06-19: qty 20, 7d supply, fill #0

## 2020-06-19 NOTE — Addendum Note (Signed)
Addended by: Dierdre Forth on: 06/19/2020 02:28 PM   Modules accepted: Orders

## 2020-06-19 NOTE — Progress Notes (Signed)
We are sorry that you are not feeling well.  Here is how we plan to help!  Based on your presentation I believe you most likely have A cough due to a virus.  This is called viral bronchitis and is best treated by rest, plenty of fluids and control of the cough.  You may use Ibuprofen or Tylenol as directed to help your symptoms.     In addition you may use A prescription cough medication called Tessalon Perles 100mg . You may take 1-2 capsules every 8 hours as needed for your cough.  Prednisone 40 mg daily for 5 days.  I will also prescribe albuterol and Singulair.  At this time, I would not repeat the course of antibiotics.      From your responses in the eVisit questionnaire you describe inflammation in the upper respiratory tract which is causing a significant cough.  This is commonly called Bronchitis and has four common causes:    Allergies  Viral Infections  Acid Reflux  Bacterial Infection Allergies, viruses and acid reflux are treated by controlling symptoms or eliminating the cause. An example might be a cough caused by taking certain blood pressure medications. You stop the cough by changing the medication. Another example might be a cough caused by acid reflux. Controlling the reflux helps control the cough.  USE OF BRONCHODILATOR ("RESCUE") INHALERS: There is a risk from using your bronchodilator too frequently.  The risk is that over-reliance on a medication which only relaxes the muscles surrounding the breathing tubes can reduce the effectiveness of medications prescribed to reduce swelling and congestion of the tubes themselves.  Although you feel brief relief from the bronchodilator inhaler, your asthma may actually be worsening with the tubes becoming more swollen and filled with mucus.  This can delay other crucial treatments, such as oral steroid medications. If you need to use a bronchodilator inhaler daily, several times per day, you should discuss this with your  provider.  There are probably better treatments that could be used to keep your asthma under control.     HOME CARE . Only take medications as instructed by your medical team. . Complete the entire course of an antibiotic. . Drink plenty of fluids and get plenty of rest. . Avoid close contacts especially the very young and the elderly . Cover your mouth if you cough or cough into your sleeve. . Always remember to wash your hands . A steam or ultrasonic humidifier can help congestion.   GET HELP RIGHT AWAY IF: . You develop worsening fever. . You become short of breath . You cough up blood. . Your symptoms persist after you have completed your treatment plan MAKE SURE YOU   Understand these instructions.  Will watch your condition.  Will get help right away if you are not doing well or get worse.  Your e-visit answers were reviewed by a board certified advanced clinical practitioner to complete your personal care plan.  Depending on the condition, your plan could have included both over the counter or prescription medications. If there is a problem please reply  once you have received a response from your provider. Your safety is important to .  If you have drug allergies check your prescription carefully.    You can use MyChart to ask questions about today's visit, request a non-urgent call back, or ask for a work or school excuse for 24 hours related to this e-Visit. If it has been greater than 24 hours you will need  to follow up with your provider, or enter a new e-Visit to address those concerns. You will get an e-mail in the next two days asking about your experience.  I hope that your e-visit has been valuable and will speed your recovery. Thank you for using e-visits.  Greater than 5 minutes, yet less than 10 minutes of time have been spent researching, coordinating, and implementing care for this patient today

## 2020-09-21 ENCOUNTER — Telehealth: Payer: Self-pay | Admitting: Emergency Medicine

## 2020-09-21 ENCOUNTER — Telehealth: Payer: 59

## 2020-09-21 ENCOUNTER — Encounter: Payer: Self-pay | Admitting: Oncology

## 2020-09-21 DIAGNOSIS — R220 Localized swelling, mass and lump, head: Secondary | ICD-10-CM

## 2020-09-21 NOTE — Patient Instructions (Signed)
Use lip/skin moisturizers.   This could be sunburn, developing cold sore, or less likely an allergic reaction.  If you develop a skin lesion, try taking over-the-counter abreva.  If a lesion develops in the next day or two, you can also reply to me and I'll send you in a prescription for cold sore medication.  If you develop a rash, you can take benadryl.  Go to an urgent care or emergency department if you have any new or worsening symptoms that you find concerning.

## 2020-09-21 NOTE — Progress Notes (Signed)
Virtual Visit Consent   [name], you are scheduled for a virtual visit with a [facility] provider today.     Just as with appointments in the office, your consent must be obtained to participate.  Your consent will be active for this visit and any virtual visit you may have with one of our providers in the next 365 days.     If you have a MyChart account, a copy of this consent can be sent to you electronically.  All virtual visits are billed to your insurance company just like a traditional visit in the office.    As this is a virtual visit, video technology does not allow for your provider to perform a traditional examination.  This may limit your provider's ability to fully assess your condition.  If your provider identifies any concerns that need to be evaluated in person or the need to arrange testing (such as labs, EKG, etc.), we will make arrangements to do so.     Although advances in technology are sophisticated, we cannot ensure that it will always work on either your end or our end.  If the connection with a video visit is poor, the visit may have to be switched to a telephone visit.  With either a video or telephone visit, we are not always able to ensure that we have a secure connection.     I need to obtain your verbal consent now.   Are you willing to proceed with your visit today?    Tynetta Bachmann Freels has provided verbal consent on 09/21/2020 for a virtual visit video.   Roxy Horseman, PA-C   Date: 09/21/2020 2:29 PM   Virtual Visit via Video Note   I, Roxy Horseman, connected with  Caroline Longie  ([id number], [birth year]) on 09/21/20 at  2:30 PM EDT by a video-enabled telemedicine application and verified that I am speaking with the correct person using two identifiers.  Location: Patient: Virtual Visit Location Patient: Mobile Provider: Virtual Visit Location Provider: Home Office   I discussed the limitations of evaluation and management by telemedicine  and the availability of in person appointments. The patient expressed understanding and agreed to proceed.    History of Present Illness: Kristin Sellers is a 35 y.o. who identifies as a female who was assigned female at birth, and is being seen today for lip swelling. She states that she is at the beach in [place] [facility] and noticed some swelling and discomfort to her lower lip.  She states that it may have been sunburned.  She states that she was bitten by some insects as well.  Denies any new foods.  Denies history of cold sores, but does states that the lip is painful.  No successful treatments tried.  HPI: HPI  Problems:  Patient Active Problem List   Diagnosis Date Noted   HLD (hyperlipidemia) 06/19/2020   IDA (iron deficiency anemia) 03/23/2020   Fracture of left shoulder with routine healing 07/18/2014   Vitamin D deficiency 11/29/2013   Overweight 11/29/2013   History of elevated lipids 09/07/2013   Bell palsy 04/24/2012   Heart palpitations 10/04/2010    Allergies:  Allergies  Allergen Reactions   Sulfa Antibiotics Hives   Medications:  Current Outpatient Medications:    albuterol (VENTOLIN HFA) 108 (90 Base) MCG/ACT inhaler, Inhale 2 puffs into the lungs every 2 (two) hours as needed for wheezing or shortness of breath (cough)., Disp: 8.5 g, Rfl: 0   amoxicillin-clavulanate (AUGMENTIN) 875-125  MG tablet, Take 1 tablet by mouth 2 (two) times daily. One po bid x 7 days, Disp: 14 tablet, Rfl: 0   benzonatate (TESSALON PERLES) 100 MG capsule, Take 1 capsule (100 mg total) by mouth 3 (three) times daily as needed for cough (cough)., Disp: 20 capsule, Rfl: 0   butalbital-acetaminophen-caffeine (FIORICET) 50-325-40 MG tablet, Take 1 tablet by mouth every 6 (six) hours as needed for headache., Disp: 20 tablet, Rfl: 0   cyclobenzaprine (FLEXERIL) 10 MG tablet, TAKE 1 TABLET (10 MG TOTAL) BY MOUTH 3 (THREE) TIMES DAILY AS NEEDED FOR MUSCLE SPASMS., Disp: 20 tablet, Rfl: 0   fluconazole  (DIFLUCAN) 200 MG tablet, TAKE 1 TABLET BY MOUTH ONCE FOR 1 DOSE AS DIRECTED, Disp: 2 tablet, Rfl: 0   fluticasone (FLONASE) 50 MCG/ACT nasal spray, Place 2 sprays into both nostrils daily., Disp: 16 g, Rfl: 0   metoprolol tartrate (LOPRESSOR) 25 MG tablet, TKE 1/2 TABLET BY MOUTH TWO TIMES DAILY AS NEEDED FOR PALPITATIONS, Disp: 60 tablet, Rfl: 0   montelukast (SINGULAIR) 10 MG tablet, Take 1 tablet (10 mg total) by mouth at bedtime., Disp: 30 tablet, Rfl: 0   predniSONE (DELTASONE) 20 MG tablet, Take 2 tablets (40 mg total) by mouth daily., Disp: 10 tablet, Rfl: 0  Observations/Objective: Patient is well-developed, well-nourished in no acute distress.  Resting comfortably in her car.  Head is normocephalic, atraumatic.  No labored breathing.  Speech is clear and coherent with logical content.  Patient is alert and oriented at baseline.  Lower lip is mildly swollen, no visible lesions  Assessment and Plan: 1. Lip swelling - Careful monitoring, if a lesion develops, take Abreva or consider treatment with valtrex, but no hx of cold sores -? Sunburn, conservative therapy recommended.  Follow Up Instructions: I discussed the assessment and treatment plan with the patient. The patient was provided an opportunity to ask questions and all were answered. The patient agreed with the plan and demonstrated an understanding of the instructions.  A copy of instructions were sent to the patient via MyChart.  The patient was advised to call back or seek an in-person evaluation if the symptoms worsen or if the condition fails to improve as anticipated.  Time:  I spent 10 minutes with the patient via telehealth technology discussing the above problems/concerns.    Roxy Horseman, PA-C

## 2020-10-20 ENCOUNTER — Encounter: Payer: Self-pay | Admitting: Physician Assistant

## 2020-10-20 ENCOUNTER — Telehealth: Payer: Self-pay | Admitting: Physician Assistant

## 2020-10-20 DIAGNOSIS — R059 Cough, unspecified: Secondary | ICD-10-CM

## 2020-10-20 DIAGNOSIS — J4521 Mild intermittent asthma with (acute) exacerbation: Secondary | ICD-10-CM

## 2020-10-20 MED ORDER — PREDNISONE 20 MG PO TABS: 20.0000 mg | ORAL_TABLET | Freq: Every day | ORAL | 0 refills | Status: DC

## 2020-10-20 MED ORDER — BENZONATATE 100 MG PO CAPS: 100.0000 mg | ORAL_CAPSULE | Freq: Two times a day (BID) | ORAL | 0 refills | Status: DC | PRN

## 2020-10-20 MED ORDER — ALBUTEROL SULFATE HFA 108 (90 BASE) MCG/ACT IN AERS: 2.0000 | INHALATION_SPRAY | Freq: Four times a day (QID) | RESPIRATORY_TRACT | 0 refills | Status: DC | PRN

## 2020-10-20 NOTE — Progress Notes (Signed)
Visit for Asthma  Based on what you have shared with me, it looks like you may have a flare up of your asthma.  Asthma is a chronic (ongoing) lung disease which results in airway obstruction, inflammation and hyper-responsiveness.   Asthma symptoms vary from person to person, with common symptoms including nighttime awakening and decreased ability to participate in normal activities as a result of shortness of breath. It is often triggered by changes in weather, changes in the season, changes in air temperature, or inside (home, school, daycare or work) allergens such as animal dander, mold, mildew, woodstoves or cockroaches.   It can also be triggered by hormonal changes, extreme emotion, physical exertion or an upper respiratory tract illness.     It is important to identify the trigger, and then eliminate or avoid the trigger if possible.   If you have been prescribed medications to be taken on a regular basis, it is important to follow the asthma action plan and to follow guidelines to adjust medication in response to increasing symptoms of decreased peak expiratory flow rate  Treatment: I have prescribed: Albuterol (Proventil HFA; Ventolin HFA) 108 (90 Base) MCG/ACT Inhaler 2 puffs into the lungs every six hours as needed for wheezing or shortness of breath and Prednisone 40mg  by mouth per day for 5 - 7 days    HOME CARE Only take medications as instructed by your medical team. Consider wearing a mask or scarf to improve breathing air temperature have been shown to decrease irritation and decrease exacerbations Get rest. Taking a steamy shower or using a humidifier may help nasal congestion sand ease sore throat pain. You can place a towel over your head and breathe in the steam from hot water coming from a faucet. Using a saline nasal spray works much the same way.  Cough drops,  hare candies and sore throat lozenges may ease your cough.  Avoid close contacts especially the very you and the elderly Cover your mouth if you cough or sneeze Always remember to wash your hands.    GET HELP RIGHT AWAY IF: You develop worsening symptoms; breathlessness at rest, drowsy, confused or agitated, unable to speak in full sentences You have coughing fits You develop a severe headache or visual changes You develop shortness of breath, difficulty breathing or start having chest pain Your symptoms persist after you have completed your treatment plan If your symptoms do not improve within 10 days  MAKE SURE YOU Understand these instructions. Will watch your condition. Will get help right away if you are not doing well or get worse.   Your e-visit answers were reviewed by a board certified advanced clinical practitioner to complete your personal care plan, Depending upon the condition, your plan could have included both over the counter or prescription medications.   Please review your pharmacy choice. Your safety is important to . If you have drug allergies check your prescription carefully.  You can use MyChart to ask questions about today's visit, request a non-urgent  call back, or ask for a work or school excuse for 24 hours related to this e-Visit. If it has been greater than 24 hours you will need to follow up with your provider, or enter a new e-Visit to address those concerns.   You will get an e-mail in the next two days asking about your experience. I hope that your e-visit has been valuable and will speed your recovery. Thank you for using e-visits. I spent 5-10 minutes on review and  completion of this note- Lacy Duverney [facility]

## 2020-12-07 ENCOUNTER — Encounter: Payer: Self-pay | Admitting: Oncology

## 2020-12-12 ENCOUNTER — Telehealth: Payer: Self-pay | Admitting: Physician Assistant

## 2020-12-12 DIAGNOSIS — J4520 Mild intermittent asthma, uncomplicated: Secondary | ICD-10-CM

## 2020-12-12 DIAGNOSIS — B9689 Other specified bacterial agents as the cause of diseases classified elsewhere: Secondary | ICD-10-CM

## 2020-12-12 DIAGNOSIS — J208 Acute bronchitis due to other specified organisms: Secondary | ICD-10-CM

## 2020-12-12 MED ORDER — PREDNISONE 20 MG PO TABS
40.0000 mg | ORAL_TABLET | Freq: Every day | ORAL | 0 refills | Status: DC
Start: 2020-12-12 — End: 2021-10-28

## 2020-12-12 MED ORDER — BENZONATATE 100 MG PO CAPS: 100.0000 mg | ORAL_CAPSULE | Freq: Three times a day (TID) | ORAL | 0 refills | Status: DC | PRN

## 2020-12-12 MED ORDER — AZITHROMYCIN 250 MG PO TABS: ORAL_TABLET | ORAL | 0 refills | Status: AC

## 2020-12-12 NOTE — Progress Notes (Signed)
I have spent 5 minutes in review of e-visit questionnaire, review and updating patient chart, medical decision making and response to patient.   Deniqua Perry Cody Cartina Brousseau, PA-C    

## 2020-12-12 NOTE — Progress Notes (Signed)
We are sorry that you are not feeling well.  Here is how we plan to help!  Based on your presentation I believe you most likely have A cough due to bacteria.  When patients have a fever and a productive cough with a change in color or increased sputum production, we are concerned about bacterial bronchitis.  If left untreated it can progress to pneumonia.  If your symptoms do not improve with your treatment plan it is important that you contact your provider.   I have prescribed Azithromyin 250 mg: two tablets now and then one tablet daily for 4 additonal days    In addition you may use A prescription cough medication called Tessalon Perles 100mg . You may take 1-2 capsules every 8 hours as needed for your cough.  I am sending in a script for prednisone 40 mg daily for 5 days. This is to start only if you note worsening of asthma symptoms due to current illness  From your responses in the eVisit questionnaire you describe inflammation in the upper respiratory tract which is causing a significant cough.  This is commonly called Bronchitis and has four common causes:   Allergies Viral Infections Acid Reflux Bacterial Infection Allergies, viruses and acid reflux are treated by controlling symptoms or eliminating the cause. An example might be a cough caused by taking certain blood pressure medications. You stop the cough by changing the medication. Another example might be a cough caused by acid reflux. Controlling the reflux helps control the cough.  USE OF BRONCHODILATOR ("RESCUE") INHALERS: There is a risk from using your bronchodilator too frequently.  The risk is that over-reliance on a medication which only relaxes the muscles surrounding the breathing tubes can reduce the effectiveness of medications prescribed to reduce swelling and congestion of the tubes themselves.  Although you feel brief relief from the bronchodilator inhaler, your asthma may actually be worsening with the tubes becoming  more swollen and filled with mucus.  This can delay other crucial treatments, such as oral steroid medications. If you need to use a bronchodilator inhaler daily, several times per day, you should discuss this with your provider.  There are probably better treatments that could be used to keep your asthma under control.     HOME CARE Only take medications as instructed by your medical team. Complete the entire course of an antibiotic. Drink plenty of fluids and get plenty of rest. Avoid close contacts especially the very young and the elderly Cover your mouth if you cough or cough into your sleeve. Always remember to wash your hands A steam or ultrasonic humidifier can help congestion.   GET HELP RIGHT AWAY IF: You develop worsening fever. You become short of breath You cough up blood. Your symptoms persist after you have completed your treatment plan MAKE SURE YOU  Understand these instructions. Will watch your condition. Will get help right away if you are not doing well or get worse.    Thank you for choosing an e-visit.  Your e-visit answers were reviewed by a board certified advanced clinical practitioner to complete your personal care plan. Depending upon the condition, your plan could have included both over the counter or prescription medications.  Please review your pharmacy choice. Make sure the pharmacy is open so you can pick up prescription now. If there is a problem, you may contact your provider through and have the prescription routed to another pharmacy.  Your safety is important to [employer]. If you have  drug allergies check your prescription carefully.   For the next 24 hours you can use MyChart to ask questions about today's visit, request a non-urgent call back, or ask for a work or school excuse. You will get an email in the next two days asking about your experience. I hope that your e-visit has been valuable and will speed your recovery.

## 2021-01-07 ENCOUNTER — Encounter: Payer: Self-pay | Admitting: Oncology

## 2021-02-25 ENCOUNTER — Encounter: Payer: Self-pay | Admitting: Oncology

## 2021-02-27 ENCOUNTER — Encounter: Payer: Self-pay | Admitting: Oncology

## 2021-07-14 ENCOUNTER — Telehealth: Payer: Self-pay | Admitting: Family

## 2021-07-14 DIAGNOSIS — J069 Acute upper respiratory infection, unspecified: Secondary | ICD-10-CM

## 2021-07-14 MED ORDER — FLUTICASONE PROPIONATE 50 MCG/ACT NA SUSP: 2.0000 | Freq: Every day | NASAL | 6 refills | Status: DC

## 2021-07-14 MED ORDER — BENZONATATE 100 MG PO CAPS: 100.0000 mg | ORAL_CAPSULE | Freq: Three times a day (TID) | ORAL | 0 refills | Status: DC | PRN

## 2021-07-14 NOTE — Progress Notes (Signed)

## 2021-10-28 ENCOUNTER — Telehealth: Payer: Self-pay | Admitting: Physician Assistant

## 2021-10-28 ENCOUNTER — Other Ambulatory Visit: Payer: Self-pay

## 2021-10-28 ENCOUNTER — Encounter: Payer: Self-pay | Admitting: Oncology

## 2021-10-28 DIAGNOSIS — J019 Acute sinusitis, unspecified: Secondary | ICD-10-CM

## 2021-10-28 DIAGNOSIS — B9689 Other specified bacterial agents as the cause of diseases classified elsewhere: Secondary | ICD-10-CM

## 2021-10-28 MED ORDER — AMOXICILLIN-POT CLAVULANATE 875-125 MG PO TABS
1.0000 | ORAL_TABLET | Freq: Two times a day (BID) | ORAL | 0 refills | Status: DC
  Filled 2021-10-28: qty 14, 7d supply, fill #0

## 2021-10-28 NOTE — Progress Notes (Signed)

## 2021-12-12 ENCOUNTER — Telehealth: Payer: Self-pay | Admitting: Nurse Practitioner

## 2021-12-12 DIAGNOSIS — R051 Acute cough: Secondary | ICD-10-CM

## 2021-12-12 MED ORDER — PREDNISONE 20 MG PO TABS
40.0000 mg | ORAL_TABLET | Freq: Every day | ORAL | 0 refills | Status: AC
  Filled 2021-12-12: qty 10, 5d supply, fill #0

## 2021-12-12 NOTE — Progress Notes (Signed)
We are sorry that you are not feeling well.  Here is how we plan to help!  Based on your presentation I believe you most likely have A cough due to a virus.  This is called viral bronchitis and is best treated by rest, plenty of fluids and control of the cough.  You may use Ibuprofen or Tylenol as directed to help your symptoms.     In addition you may use A non-prescription cough medication called Mucinex DM: take 2 tablets every 12 hours.  Prednisone 20mg - 2 tablets at the same time daily for 5 days.  From your responses in the eVisit questionnaire you describe inflammation in the upper respiratory tract which is causing a significant cough.  This is commonly called Bronchitis and has four common causes:   Allergies Viral Infections Acid Reflux Bacterial Infection Allergies, viruses and acid reflux are treated by controlling symptoms or eliminating the cause. An example might be a cough caused by taking certain blood pressure medications. You stop the cough by changing the medication. Another example might be a cough caused by acid reflux. Controlling the reflux helps control the cough.  USE OF BRONCHODILATOR ("RESCUE") INHALERS: There is a risk from using your bronchodilator too frequently.  The risk is that over-reliance on a medication which only relaxes the muscles surrounding the breathing tubes can reduce the effectiveness of medications prescribed to reduce swelling and congestion of the tubes themselves.  Although you feel brief relief from the bronchodilator inhaler, your asthma may actually be worsening with the tubes becoming more swollen and filled with mucus.  This can delay other crucial treatments, such as oral steroid medications. If you need to use a bronchodilator inhaler daily, several times per day, you should discuss this with your provider.  There are probably better treatments that could be used to keep your asthma under control.     HOME CARE Only take medications as  instructed by your medical team. Complete the entire course of an antibiotic. Drink plenty of fluids and get plenty of rest. Avoid close contacts especially the very young and the elderly Cover your mouth if you cough or cough into your sleeve. Always remember to wash your hands A steam or ultrasonic humidifier can help congestion.   GET HELP RIGHT AWAY IF: You develop worsening fever. You become short of breath You cough up blood. Your symptoms persist after you have completed your treatment plan MAKE SURE YOU  Understand these instructions. Will watch your condition. Will get help right away if you are not doing well or get worse.    Thank you for choosing an e-visit.  Your e-visit answers were reviewed by a board certified advanced clinical practitioner to complete your personal care plan. Depending upon the condition, your plan could have included both over the counter or prescription medications.  Please review your pharmacy choice. Make sure the pharmacy is open so you can pick up prescription now. If there is a problem, you may contact your provider through MyChart messaging and have the prescription routed to another pharmacy.  Your safety is important to us. If you have drug allergies check your prescription carefully.   For the next 24 hours you can use MyChart to ask questions about today's visit, request a non-urgent call back, or ask for a work or school excuse. You will get an email in the next two days asking about your experience. I hope that your e-visit has been valuable and will speed your recovery.     Mary-Margaret Jereline Ticer, FNP   5-10 minutes spent reviewing and documenting in chart.  

## 2021-12-13 ENCOUNTER — Encounter: Payer: Self-pay | Admitting: Oncology

## 2021-12-13 ENCOUNTER — Other Ambulatory Visit: Payer: Self-pay

## 2022-02-12 ENCOUNTER — Telehealth: Payer: Self-pay | Admitting: Physician Assistant

## 2022-02-12 DIAGNOSIS — J019 Acute sinusitis, unspecified: Secondary | ICD-10-CM

## 2022-02-12 DIAGNOSIS — B9689 Other specified bacterial agents as the cause of diseases classified elsewhere: Secondary | ICD-10-CM

## 2022-02-12 MED ORDER — AMOXICILLIN-POT CLAVULANATE 875-125 MG PO TABS: 1.0000 | ORAL_TABLET | Freq: Two times a day (BID) | ORAL | 0 refills | Status: DC

## 2022-02-12 NOTE — Progress Notes (Signed)
I have spent 5 minutes in review of e-visit questionnaire, review and updating patient chart, medical decision making and response to patient.   Helem Reesor Cody Mischa Brittingham, PA-C    

## 2022-02-12 NOTE — Progress Notes (Signed)

## 2022-05-07 ENCOUNTER — Encounter: Payer: Self-pay | Admitting: Oncology

## 2022-05-08 ENCOUNTER — Encounter: Payer: Self-pay | Admitting: Family Medicine

## 2022-05-08 ENCOUNTER — Ambulatory Visit: Payer: 59 | Admitting: Family Medicine

## 2022-05-08 VITALS — BP 126/85 | HR 93 | Temp 98.3°F | Resp 20 | Ht 61.81 in | Wt 184.0 lb

## 2022-05-08 DIAGNOSIS — R002 Palpitations: Secondary | ICD-10-CM

## 2022-05-08 DIAGNOSIS — E782 Mixed hyperlipidemia: Secondary | ICD-10-CM

## 2022-05-08 DIAGNOSIS — R5383 Other fatigue: Secondary | ICD-10-CM

## 2022-05-08 DIAGNOSIS — Z7689 Persons encountering health services in other specified circumstances: Secondary | ICD-10-CM

## 2022-05-08 DIAGNOSIS — E559 Vitamin D deficiency, unspecified: Secondary | ICD-10-CM | POA: Diagnosis not present

## 2022-05-08 NOTE — Progress Notes (Signed)
New Patient Office Visit  Subjective    Patient ID: Kristin Sellers, female    DOB: 1986/02/03  Age: 37 y.o. MRN: [id number]  CC:  Chief Complaint  Patient presents with   Establish Care   Fatigue   Palpitations    HPI [name] presents to establish care. Medical history significant for heart palpitations since around 37 years old, taking metoprolol 25 mg as needed.  History of workup by cardiology many years ago including EKG, Holter monitor.  Murmur detected after pregnancy and echo performed in 2018, no significant findings.  Palpitations have worsened over the past 2 years since getting COVID.  It is accompanied by fatigue.  Hyperlipidemia is controlled with diet and exercise.  Positive family history for thyroid issues, high cholesterol, heart disease.  Outpatient Encounter Medications as of 05/08/2022  Medication Sig   labetalol (NORMODYNE) 200 MG tablet Take 200 mg by mouth once.   levonorgestrel (MIRENA, 52 MG,) 20 MCG/DAY IUD 1 each by Intrauterine route once.   metoprolol tartrate (LOPRESSOR) 25 MG tablet TKE 1/2 TABLET BY MOUTH TWO TIMES DAILY AS NEEDED FOR PALPITATIONS   [DISCONTINUED] amoxicillin-clavulanate (AUGMENTIN) 875-125 MG tablet Take 1 tablet by mouth 2 (two) times daily.   No facility-administered encounter medications on file as of 05/08/2022.    Past Medical History:  Diagnosis Date   Allergy    Asthma    as a child - no problems as adult - no inhaler   Bronchitis 10/2015   tx with proair inhaler   Heart murmur    no problems ever   History of Bell's palsy    History of pre-eclampsia    Hyperlipidemia    diet controlled - no med   Palpitations    hx with prior pregnancy   Pregnancy induced hypertension    Hx GHTN - resolved after 03/2016 delivery   SVD (spontaneous vaginal delivery)    x 3    Past Surgical History:  Procedure Laterality Date   LAPAROSCOPIC BILATERAL SALPINGECTOMY Bilateral 06/12/2016   Procedure: LAPAROSCOPIC  BILATERAL SALPINGECTOMY;  Surgeon: Vanessa Kick, MD;  Location: [facility];  Service: Gynecology;  Laterality: Bilateral;   MOUTH SURGERY     wisdom teeth ext   tubes in ears     as a child    Family History  Problem Relation Age of Onset   Heart disease Mother    Hyperlipidemia Mother    Hypertension Mother    Heart disease Father    Hyperlipidemia Father    Hypertension Father    Coronary artery disease Father    Heart attack Father    Alcohol abuse Father    Arthritis Father    Cancer Father    COPD Father    Diabetes Father    Early death Paternal Grandfather    Coronary artery disease Other    Hypertension Other    Heart attack Other    Asthma Sister    Asthma Brother    Asthma Son    Cancer Maternal Grandmother    Diabetes Maternal Grandmother    Heart disease Sister    Asthma Son    Heart disease Paternal Grandmother    Hyperlipidemia Paternal Grandmother    Hypertension Paternal Grandmother    Anesthesia problems Neg Hx    Hypotension Neg Hx    Malignant hyperthermia Neg Hx    Pseudochol deficiency Neg Hx     Social History   Socioeconomic History   Marital status: Married  Spouse name: Not on file   Number of children: 2   Years of education: Not on file   Highest education level: Not on file  Occupational History   Occupation: Therapist, sports    Comment: Designer, television/film set: [employer]  Tobacco Use   Smoking status: Never    Passive exposure: Never   Smokeless tobacco: Never  Substance and Sexual Activity   Alcohol use: No   Drug use: No   Sexual activity: Yes    Birth control/protection: None  Other Topics Concern   Not on file  Social History Narrative   Not on file   Social Determinants of Health   Financial Resource Strain: Not on file  Food Insecurity: Not on file  Transportation Needs: Not on file  Physical Activity: Not on file  Stress: Not on file  Social Connections: Not on file  Intimate Partner  Violence: Not on file    Review of Systems  Constitutional:  Negative for chills, fever and malaise/fatigue.  HENT:  Negative for congestion and hearing loss.   Eyes:  Negative for blurred vision and double vision.  Respiratory:  Negative for cough and shortness of breath.   Cardiovascular:  Negative for chest pain, palpitations and leg swelling.  Gastrointestinal: Negative.  Negative for abdominal pain and heartburn.  Genitourinary:  Negative for frequency and urgency.  Musculoskeletal:  Negative for myalgias and neck pain.  Neurological:  Negative for headaches.  Endo/Heme/Allergies:  Negative for polydipsia.  Psychiatric/Behavioral:  Negative for depression. The patient is not nervous/anxious.     Objective    BP 126/85 (BP Location: Left Arm, Patient Position: Sitting, Cuff Size: Normal)   Pulse 93   Temp 98.3 F (36.8 C) (Oral)   Resp 20   Ht 5' 1.81" (1.57 m)   Wt 184 lb (83.5 kg)   SpO2 97%   BMI 33.86 kg/m   Physical Exam Constitutional:      General: She is not in acute distress.    Appearance: Normal appearance.  HENT:     Head: Normocephalic and atraumatic.     Right Ear: Tympanic membrane and ear canal normal.     Left Ear: Tympanic membrane and ear canal normal.     Nose: Nose normal.     Mouth/Throat:     Mouth: Mucous membranes are moist.     Pharynx: No oropharyngeal exudate or posterior oropharyngeal erythema.  Eyes:     Extraocular Movements: Extraocular movements intact.     Pupils: Pupils are equal, round, and reactive to light.  Cardiovascular:     Rate and Rhythm: Normal rate and regular rhythm.     Heart sounds: Normal heart sounds.  Pulmonary:     Effort: Pulmonary effort is normal.     Breath sounds: Normal breath sounds.  Abdominal:     General: Abdomen is flat. Bowel sounds are normal.  Musculoskeletal:        General: Normal range of motion.     Cervical back: Normal range of motion and neck supple.  Skin:    General: Skin is warm  and dry.  Neurological:     Mental Status: She is alert and oriented to person, place, and time.  Psychiatric:        Mood and Affect: Mood normal.     Assessment & Plan:  Encounter to establish care -     CBC with Differential/Platelet; Future -     Comprehensive metabolic  panel; Future -     Lipid panel; Future -     TSH; Future -     VITAMIN D 25 Hydroxy (Vit-D Deficiency, Fractures); Future  Heart palpitations Assessment & Plan: Asymptomatic today, so EKG deferred.  Sent referral to [facility] health heart care in [place] per patient's request.  Also testing TSH today as patient has a family history of thyroid abnormalities.  Orders: -     TSH; Future -     Ambulatory referral to Cardiology  Mixed hyperlipidemia Assessment & Plan: Checking lipid panel today.  Previously controlled with diet and exercise, will adjust management as needed.  Orders: -     Lipid panel; Future  Vitamin D deficiency Assessment & Plan: History of vitamin D deficiency, rechecking vitamin D levels today which may also be associated with fatigue.  Orders: -     VITAMIN D 25 Hydroxy (Vit-D Deficiency, Fractures); Future  Other fatigue -     CBC with Differential/Platelet; Future -     Comprehensive metabolic panel; Future -     TSH; Future -     Ambulatory referral to Cardiology    Return in about 6 months (around 11/08/2022) for annual physical, fasting blood work 1 week before.   Velva Harman, PA

## 2022-05-08 NOTE — Assessment & Plan Note (Signed)
History of vitamin D deficiency, rechecking vitamin D levels today which may also be associated with fatigue.

## 2022-05-08 NOTE — Assessment & Plan Note (Signed)
Checking lipid panel today.  Previously controlled with diet and exercise, will adjust management as needed.

## 2022-05-08 NOTE — Assessment & Plan Note (Signed)
Asymptomatic today, so EKG deferred.  Sent referral to [facility] health heart care in [place] per patient's request.  Also testing TSH today as patient has a family history of thyroid abnormalities.

## 2022-05-09 ENCOUNTER — Other Ambulatory Visit: Payer: Self-pay | Admitting: Family Medicine

## 2022-05-09 DIAGNOSIS — E559 Vitamin D deficiency, unspecified: Secondary | ICD-10-CM

## 2022-05-09 LAB — CBC WITH DIFFERENTIAL/PLATELET
Basophils Absolute: 0 10*3/uL (ref 0.0–0.2)
Basos: 1 %
EOS (ABSOLUTE): 0.2 10*3/uL (ref 0.0–0.4)
Eos: 3 %
Hematocrit: 39.9 % (ref 34.0–46.6)
Hemoglobin: 13.7 g/dL (ref 11.1–15.9)
Immature Grans (Abs): 0 10*3/uL (ref 0.0–0.1)
Immature Granulocytes: 0 %
Lymphocytes Absolute: 2.2 10*3/uL (ref 0.7–3.1)
Lymphs: 42 %
MCH: 31.9 pg (ref 26.6–33.0)
MCHC: 34.3 g/dL (ref 31.5–35.7)
MCV: 93 fL (ref 79–97)
Monocytes Absolute: 0.3 10*3/uL (ref 0.1–0.9)
Monocytes: 5 %
Neutrophils Absolute: 2.6 10*3/uL (ref 1.4–7.0)
Neutrophils: 49 %
Platelets: 300 10*3/uL (ref 150–450)
RBC: 4.3 x10E6/uL (ref 3.77–5.28)
RDW: 12.3 % (ref 11.7–15.4)
WBC: 5.3 10*3/uL (ref 3.4–10.8)

## 2022-05-09 LAB — COMPREHENSIVE METABOLIC PANEL
ALT: 18 IU/L (ref 0–32)
AST: 18 IU/L (ref 0–40)
Albumin/Globulin Ratio: 1.4 (ref 1.2–2.2)
Albumin: 4.4 g/dL (ref 3.9–4.9)
Alkaline Phosphatase: 86 IU/L (ref 44–121)
BUN/Creatinine Ratio: 14 (ref 9–23)
BUN: 10 mg/dL (ref 6–20)
Bilirubin Total: 0.3 mg/dL (ref 0.0–1.2)
CO2: 20 mmol/L (ref 20–29)
Calcium: 9 mg/dL (ref 8.7–10.2)
Chloride: 103 mmol/L (ref 96–106)
Creatinine, Ser: 0.74 mg/dL (ref 0.57–1.00)
Globulin, Total: 3.1 g/dL (ref 1.5–4.5)
Glucose: 86 mg/dL (ref 70–99)
Potassium: 4.3 mmol/L (ref 3.5–5.2)
Sodium: 139 mmol/L (ref 134–144)
Total Protein: 7.5 g/dL (ref 6.0–8.5)
eGFR: 106 mL/min/{1.73_m2} (ref >59)

## 2022-05-09 LAB — TSH: TSH: 3.49 u[IU]/mL (ref 0.450–4.500)

## 2022-05-09 LAB — VITAMIN D 25 HYDROXY (VIT D DEFICIENCY, FRACTURES): Vit D, 25-Hydroxy: 24.3 ng/mL — ABNORMAL LOW (ref 30.0–100.0)

## 2022-05-09 LAB — LIPID PANEL
Chol/HDL Ratio: 4.5 ratio — ABNORMAL HIGH (ref 0.0–4.4)
Cholesterol, Total: 206 mg/dL — ABNORMAL HIGH (ref 100–199)
HDL: 46 mg/dL (ref >39)
LDL Chol Calc (NIH): 146 mg/dL — ABNORMAL HIGH (ref 0–99)
Triglycerides: 80 mg/dL (ref 0–149)
VLDL Cholesterol Cal: 14 mg/dL (ref 5–40)

## 2022-05-09 MED ORDER — VITAMIN D (ERGOCALCIFEROL) 1.25 MG (50000 UNIT) PO CAPS: 50000.0000 [IU] | ORAL_CAPSULE | ORAL | 1 refills | Status: DC

## 2022-05-19 ENCOUNTER — Encounter: Payer: Self-pay | Admitting: Oncology

## 2022-05-27 ENCOUNTER — Ambulatory Visit (INDEPENDENT_AMBULATORY_CARE_PROVIDER_SITE_OTHER): Payer: 59

## 2022-05-27 ENCOUNTER — Encounter: Payer: Self-pay | Admitting: Podiatry

## 2022-05-27 ENCOUNTER — Encounter: Payer: Self-pay | Admitting: Oncology

## 2022-05-27 ENCOUNTER — Ambulatory Visit (INDEPENDENT_AMBULATORY_CARE_PROVIDER_SITE_OTHER): Payer: 59 | Admitting: Podiatry

## 2022-05-27 ENCOUNTER — Other Ambulatory Visit: Payer: Self-pay

## 2022-05-27 DIAGNOSIS — M778 Other enthesopathies, not elsewhere classified: Secondary | ICD-10-CM | POA: Diagnosis not present

## 2022-05-27 DIAGNOSIS — M722 Plantar fascial fibromatosis: Secondary | ICD-10-CM

## 2022-05-27 MED ORDER — DEXAMETHASONE SODIUM PHOSPHATE 120 MG/30ML IJ SOLN
2.0000 mg | Freq: Once | INTRAMUSCULAR | Status: AC
  Administered 2022-05-27: 2 mg via INTRA_ARTICULAR

## 2022-05-27 MED ORDER — MELOXICAM 15 MG PO TABS
15.0000 mg | ORAL_TABLET | Freq: Every day | ORAL | 3 refills | Status: AC
  Filled 2022-05-27: qty 30, 30d supply, fill #0

## 2022-05-27 NOTE — Progress Notes (Signed)
Subjective:  Patient ID: Kristin Sellers, female    DOB: 08-Jul-1985,  MRN: [id number] HPI Chief Complaint  Patient presents with   Foot Pain    1st MPJ right - redness, aching x few months, after taking shoes off at night it throbs, tried new shoes-no help   New Patient (Initial Visit)    37 y.o. female presents with the above complaint.   ROS: Denies fever chills nausea vomit muscle aches pains calf pain back pain chest pain shortness of breath.  Past Medical History:  Diagnosis Date   Allergy    Asthma    as a child - no problems as adult - no inhaler   Bronchitis 10/2015   tx with proair inhaler   Heart murmur    no problems ever   History of Bell's palsy    History of pre-eclampsia    Hyperlipidemia    diet controlled - no med   Palpitations    hx with prior pregnancy   Pregnancy induced hypertension    Hx GHTN - resolved after 03/2016 delivery   SVD (spontaneous vaginal delivery)    x 3   Past Surgical History:  Procedure Laterality Date   LAPAROSCOPIC BILATERAL SALPINGECTOMY Bilateral 06/12/2016   Procedure: LAPAROSCOPIC BILATERAL SALPINGECTOMY;  Surgeon: Waynard Reeds, MD;  Location: [facility];  Service: Gynecology;  Laterality: Bilateral;   MOUTH SURGERY     wisdom teeth ext   tubes in ears     as a child    Current Outpatient Medications:    meloxicam (MOBIC) 15 MG tablet, Take 1 tablet (15 mg total) by mouth daily., Disp: 30 tablet, Rfl: 3   levonorgestrel (MIRENA, 52 MG,) 20 MCG/DAY IUD, 1 each by Intrauterine route once., Disp: , Rfl:    metoprolol tartrate (LOPRESSOR) 25 MG tablet, TKE 1/2 TABLET BY MOUTH TWO TIMES DAILY AS NEEDED FOR PALPITATIONS, Disp: 60 tablet, Rfl: 0   Vitamin D, Ergocalciferol, (DRISDOL) 1.25 MG (50000 UNIT) CAPS capsule, Take 1 capsule (50,000 Units total) by mouth every 7 (seven) days., Disp: 5 capsule, Rfl: 1  Allergies  Allergen Reactions   Sulfa Antibiotics Hives   Review of Systems Objective:  There were no vitals filed for  this visit.  General: Well developed, nourished, in no acute distress, alert and oriented x3   Dermatological: Skin is warm, dry and supple bilateral. Nails x 10 are well maintained; remaining integument appears unremarkable at this time. There are no open sores, no preulcerative lesions, no rash or signs of infection present.  Vascular: Dorsalis Pedis artery and Posterior Tibial artery pedal pulses are 2/4 bilateral with immedate capillary fill time. Pedal hair growth present. No varicosities and no lower extremity edema present bilateral.   Neruologic: Grossly intact via light touch bilateral. Vibratory intact via tuning fork bilateral. Protective threshold with Semmes Wienstein monofilament intact to all pedal sites bilateral. Patellar and Achilles deep tendon reflexes 2+ bilateral. No Babinski or clonus noted bilateral.   Musculoskeletal: No gross boney pedal deformities bilateral. No pain, crepitus, or limitation noted with foot and ankle range of motion bilateral. Muscular strength 5/5 in all groups tested bilateral.  Pain on end range of motion of the first metatarsophalangeal joint and pain on palpation with the extraction of the joint first metatarsal phalangeal joint.  Gait: Unassisted, Nonantalgic.    Radiographs: Radiographs taken today demonstrate osseously mature individual slightly elevated first metatarsal possibly resulting insert some early osteoarthritic changes.   Assessment & Plan:   Assessment: Mild hallux  valgus with mild capsulitis of the first metatarsophalangeal joint right foot.  Plan: I injected the joint today after sterile alcohol prep.  I injected 2 mg of dexamethasone and local anesthetic discussed appropriate shoe gear and start her on meloxicam.     Tri Chittick T. [place], [place]

## 2022-06-01 NOTE — Progress Notes (Unsigned)
Cardiology Office Note:    Date:  06/03/2022   ID:  Kristin Sellers, DOB 1985-10-04, MRN [id number]  PCP:  Melida Quitter, PA  Cardiologist:  None  Electrophysiologist:  None   Referring MD: Melida Quitter, PA   Chief Complaint  Patient presents with   Palpitations    History of Present Illness:    Kristin Sellers is a 37 y.o. female with a hx of hyperlipidemia, pregnancy-induced hypertension who was referred by Saralyn Pilar, PA for evaluation of palpitations.  She reports she has had palpitations for 15 years.  Wore Holter monitor 15 years ago.  Has been on as needed Lopressor.  Recently though palpitations have gotten worse.  Now happening multiple times a day, sometimes lasting few seconds but sometimes can last up to few minutes.  Feels like it is brought on by exertion.  Heart feels like it is irregular and racing during episodes.  Occurs with minimal exertion.  Also reports feeling short of breath with exertion.  She denies any chest pain.  Denies any lightheadedness, syncope, or lower extremity edema.  No smoking history.  Family history includes father had CABG in 10s, paternal grandfather died of MI in 22s.  Mother has had SVT ablation.  Echocardiogram 02/2016 while pregnant showed EF 60 to 65%, normal RV, grade 2 diastolic dysfunction, no significant valvular disease.     Past Medical History:  Diagnosis Date   Allergy    Asthma    as a child - no problems as adult - no inhaler   Bronchitis 10/2015   tx with proair inhaler   Heart murmur    no problems ever   History of Bell's palsy    History of pre-eclampsia    Hyperlipidemia    diet controlled - no med   Palpitations    hx with prior pregnancy   Pregnancy induced hypertension    Hx GHTN - resolved after 03/2016 delivery   SVD (spontaneous vaginal delivery)    x 3    Past Surgical History:  Procedure Laterality Date   LAPAROSCOPIC BILATERAL SALPINGECTOMY Bilateral 06/12/2016   Procedure:  LAPAROSCOPIC BILATERAL SALPINGECTOMY;  Surgeon: Waynard Reeds, MD;  Location: [facility];  Service: Gynecology;  Laterality: Bilateral;   MOUTH SURGERY     wisdom teeth ext   tubes in ears     as a child    Current Medications: Current Meds  Medication Sig   levonorgestrel (MIRENA, 52 MG,) 20 MCG/DAY IUD 1 each by Intrauterine route once.   meloxicam (MOBIC) 15 MG tablet Take 1 tablet (15 mg total) by mouth daily.   Vitamin D, Ergocalciferol, (DRISDOL) 1.25 MG (50000 UNIT) CAPS capsule Take 1 capsule (50,000 Units total) by mouth every 7 (seven) days.     Allergies:   Sulfa antibiotics   Social History   Socioeconomic History   Marital status: Married    Spouse name: Not on file   Number of children: 2   Years of education: Not on file   Highest education level: Not on file  Occupational History   Occupation: Charity fundraiser    Comment: Diplomatic Services operational officer: [employer]  Tobacco Use   Smoking status: Never    Passive exposure: Never   Smokeless tobacco: Never  Substance and Sexual Activity   Alcohol use: No   Drug use: No   Sexual activity: Yes    Birth control/protection: None  Other Topics Concern   Not  on file  Social History Narrative   Not on file   Social Determinants of Health   Financial Resource Strain: Not on file  Food Insecurity: Not on file  Transportation Needs: Not on file  Physical Activity: Not on file  Stress: Not on file  Social Connections: Not on file     Family History: The patient's family history includes Alcohol abuse in her father; Arthritis in her father; Asthma in her brother, sister, son, and son; COPD in her father; Cancer in her father and maternal grandmother; Coronary artery disease in her father and another family member; Diabetes in her father and maternal grandmother; Early death in her paternal grandfather; Heart attack in her father and another family member; Heart disease in her father, mother, paternal  grandmother, and sister; Hyperlipidemia in her father, mother, and paternal grandmother; Hypertension in her father, mother, paternal grandmother, and another family member. There is no history of Anesthesia problems, Hypotension, Malignant hyperthermia, or Pseudochol deficiency.  ROS:   Please see the history of present illness.     All other systems reviewed and are negative.  EKGs/Labs/Other Studies Reviewed:    The following studies were reviewed today:   EKG:   06/03/2022: Normal sinus rhythm, rate 88, no ST abnormality  Recent Labs: 05/08/2022: ALT 18; BUN 10; Creatinine, Ser 0.74; Hemoglobin 13.7; Platelets 300; Potassium 4.3; Sodium 139; TSH 3.490  Recent Lipid Panel    Component Value Date/Time   CHOL 206 (H) 05/08/2022 1104   TRIG 80 05/08/2022 1104   HDL 46 05/08/2022 1104   CHOLHDL 4.5 (H) 05/08/2022 1104   CHOLHDL 4 05/31/2019 1109   VLDL 14.6 05/31/2019 1109   LDLCALC 146 (H) 05/08/2022 1104    Physical Exam:    VS:  BP 102/78 (BP Location: Left Arm, Patient Position: Sitting, Cuff Size: Normal)   Pulse 88   Ht 5' 1.81" (1.57 m)   Wt 182 lb (82.6 kg)   SpO2 98%   BMI 33.49 kg/m     Wt Readings from Last 3 Encounters:  06/03/22 182 lb (82.6 kg)  05/08/22 184 lb (83.5 kg)  10/13/19 146 lb (66.2 kg)     GEN:  Well nourished, well developed in no acute distress HEENT: Normal NECK: No JVD; No carotid bruits LYMPHATICS: No lymphadenopathy CARDIAC: RRR, no murmurs, rubs, gallops RESPIRATORY:  Clear to auscultation without rales, wheezing or rhonchi  ABDOMEN: Soft, non-tender, non-distended MUSCULOSKELETAL:  No edema; No deformity  SKIN: Warm and dry NEUROLOGIC:  Alert and oriented x 3 PSYCHIATRIC:  Normal affect   ASSESSMENT:    1. Palpitations   2. DOE (dyspnea on exertion)   3. Hyperlipidemia, unspecified hyperlipidemia type    PLAN:    Palpitations: Description concerning for arrhythmia, evaluate with Zio patch x 7 days.  Given symptoms occur  with exertion, will check ETT  DOE: Low suspicion for CAD given age but does have significant family history.  Check ETT as above  Hyperlipidemia: LDL 146 on 05/08/2022.  Diet/exercise encouraged  RTC in 3 months  Shared Decision Making/Informed Consent The risks [chest pain, shortness of breath, cardiac arrhythmias, dizziness, blood pressure fluctuations, myocardial infarction, stroke/transient ischemic attack, and life-threatening complications (estimated to be 1 in 10,000)], benefits (risk stratification, diagnosing coronary artery disease, treatment guidance) and alternatives of an exercise tolerance test were discussed in detail with Ms. Kachel and she agrees to proceed.    Medication Adjustments/Labs and Tests Ordered: Current medicines are reviewed at length with the patient today.  Concerns regarding medicines are outlined above.  Orders Placed This Encounter  Procedures   LONG TERM MONITOR (3-14 DAYS)   EXERCISE TOLERANCE TEST (ETT)   EKG 12-Lead   No orders of the defined types were placed in this encounter.   Patient Instructions  Medication Instructions:  Your physician recommends that you continue on your current medications as directed. Please refer to the Current Medication list given to you today.  *If you need a refill on your cardiac medications before your next appointment, please call your pharmacy*  Testing/Procedures: Your physician has requested that you have an exercise tolerance test. For further information please visit [email or web address] Please also follow instruction sheet, as given.  ZIO XT- Long Term Monitor Instructions   Your physician has requested you wear a ZIO patch monitor for _7_ days.  This is a single patch monitor.   [employer] supplies one patch monitor per enrollment. Additional stickers are not available. Please do not apply patch if you will be having a Nuclear Stress Test, Echocardiogram, Cardiac CT, MRI, or Chest Xray during the period  you would be wearing the monitor. The patch cannot be worn during these tests. You cannot remove and re-apply the ZIO XT patch monitor.  Your ZIO patch monitor will be sent Fed Ex from [employer] directly to your home address. It may take 3-5 days to receive your monitor after you have been enrolled.  Once you have received your monitor, please review the enclosed instructions. Your monitor has already been registered assigning a specific monitor serial # to you.  Billing and Patient Assistance Program Information   We have supplied [employer] with any of your insurance information on file for billing purposes. [employer] offers a sliding scale Patient Assistance Program for patients that do not have insurance, or whose insurance does not completely cover the cost of the ZIO monitor.   You must apply for the Patient Assistance Program to qualify for this discounted rate.     To apply, please call [employer] at [phone], select option 4, then select option 2, and ask to apply for Patient Assistance Program.  Meredeth Ide will ask your household income, and how many people are in your household.  They will quote your out-of-pocket cost based on that information.  [employer] will also be able to set up a 85-month, interest-free payment plan if needed.  Applying the monitor   Shave hair from upper left chest.  Hold abrader disc by orange tab. Rub abrader in 40 strokes over the upper left chest as indicated in your monitor instructions.  Clean area with 4 enclosed alcohol pads. Let dry.  Apply patch as indicated in monitor instructions. Patch will be placed under collarbone on left side of chest with arrow pointing upward.  Rub patch adhesive wings for 2 minutes. Remove white label marked "1". Remove the white label marked "2". Rub patch adhesive wings for 2 additional minutes.  While looking in a mirror, press and release button in center of patch. A small green light will flash 3-4 times. This will be your  only indicator that the monitor has been turned on. ?  Do not shower for the first 24 hours. You may shower after the first 24 hours.  Press the button if you feel a symptom. You will hear a small click. Record Date, Time and Symptom in the Patient Logbook.  When you are ready to remove the patch, follow instructions on the last 2 pages of the Patient Logbook. Stick patch monitor  onto the last page of Patient Logbook.  Place Patient Logbook in the blue and white box.  Use locking tab on box and tape box closed securely.  The blue and white box has prepaid postage on it. Please place it in the mailbox as soon as possible. Your physician should have your test results approximately 7 days after the monitor has been mailed back to [facility].  Call [facility] Customer Care at [phone] if you have questions regarding your ZIO XT patch monitor. Call them immediately if you see an orange light blinking on your monitor.  If your monitor falls off in less than 4 days, contact our Monitor department at [phone]. ?If your monitor becomes loose or falls off after 4 days call [employer] at [phone] for suggestions on securing your monitor.?    Follow-Up: At [facility], you and your health needs are our priority.  As part of our continuing mission to provide you with exceptional heart care, we have created designated Provider Care Teams.  These Care Teams include your primary Cardiologist (physician) and Advanced Practice Providers (APPs -  Physician Assistants and Nurse Practitioners) who all work together to provide you with the care you need, when you need it.  We recommend signing up for the patient portal called "MyChart".  Sign up information is provided on this After Visit Summary.  MyChart is used to connect with patients for Virtual Visits (Telemedicine).  Patients are able to view lab/test results, encounter notes, upcoming appointments, etc.  Non-urgent messages can be sent  to your provider as well.   To learn more about what you can do with MyChart, go to [email or web address].    Your next appointment:   3 month(s)  Provider:   Dr. Bjorn Pippin   Signed, Little Ishikawa, MD  06/03/2022 11:25 AM    [facility]

## 2022-06-03 ENCOUNTER — Ambulatory Visit: Payer: 59 | Attending: Cardiology

## 2022-06-03 ENCOUNTER — Ambulatory Visit: Payer: 59 | Attending: Cardiology | Admitting: Cardiology

## 2022-06-03 ENCOUNTER — Encounter: Payer: Self-pay | Admitting: Cardiology

## 2022-06-03 VITALS — BP 102/78 | HR 88 | Ht 61.81 in | Wt 182.0 lb

## 2022-06-03 DIAGNOSIS — R0609 Other forms of dyspnea: Secondary | ICD-10-CM

## 2022-06-03 DIAGNOSIS — R002 Palpitations: Secondary | ICD-10-CM

## 2022-06-03 DIAGNOSIS — E785 Hyperlipidemia, unspecified: Secondary | ICD-10-CM

## 2022-06-03 NOTE — Progress Notes (Unsigned)
Enrolled for [employer] to mail a ZIO XT long term holter monitor to the patients address on file.  

## 2022-06-03 NOTE — Patient Instructions (Signed)
Medication Instructions:  Your physician recommends that you continue on your current medications as directed. Please refer to the Current Medication list given to you today.  *If you need a refill on your cardiac medications before your next appointment, please call your pharmacy*  Testing/Procedures: Your physician has requested that you have an exercise tolerance test. For further information please visit [email or web address] Please also follow instruction sheet, as given.  ZIO XT- Long Term Monitor Instructions   Your physician has requested you wear a ZIO patch monitor for _7_ days.  This is a single patch monitor.   [employer] supplies one patch monitor per enrollment. Additional stickers are not available. Please do not apply patch if you will be having a Nuclear Stress Test, Echocardiogram, Cardiac CT, MRI, or Chest Xray during the period you would be wearing the monitor. The patch cannot be worn during these tests. You cannot remove and re-apply the ZIO XT patch monitor.  Your ZIO patch monitor will be sent Fed Ex from [employer] directly to your home address. It may take 3-5 days to receive your monitor after you have been enrolled.  Once you have received your monitor, please review the enclosed instructions. Your monitor has already been registered assigning a specific monitor serial # to you.  Billing and Patient Assistance Program Information   We have supplied [employer] with any of your insurance information on file for billing purposes. [employer] offers a sliding scale Patient Assistance Program for patients that do not have insurance, or whose insurance does not completely cover the cost of the ZIO monitor.   You must apply for the Patient Assistance Program to qualify for this discounted rate.     To apply, please call [employer] at [phone], select option 4, then select option 2, and ask to apply for Patient Assistance Program.  Meredeth Ide will ask your household income, and how  many people are in your household.  They will quote your out-of-pocket cost based on that information.  [employer] will also be able to set up a 49-month, interest-free payment plan if needed.  Applying the monitor   Shave hair from upper left chest.  Hold abrader disc by orange tab. Rub abrader in 40 strokes over the upper left chest as indicated in your monitor instructions.  Clean area with 4 enclosed alcohol pads. Let dry.  Apply patch as indicated in monitor instructions. Patch will be placed under collarbone on left side of chest with arrow pointing upward.  Rub patch adhesive wings for 2 minutes. Remove white label marked "1". Remove the white label marked "2". Rub patch adhesive wings for 2 additional minutes.  While looking in a mirror, press and release button in center of patch. A small green light will flash 3-4 times. This will be your only indicator that the monitor has been turned on. ?  Do not shower for the first 24 hours. You may shower after the first 24 hours.  Press the button if you feel a symptom. You will hear a small click. Record Date, Time and Symptom in the Patient Logbook.  When you are ready to remove the patch, follow instructions on the last 2 pages of the Patient Logbook. Stick patch monitor onto the last page of Patient Logbook.  Place Patient Logbook in the blue and white box.  Use locking tab on box and tape box closed securely.  The blue and white box has prepaid postage on it. Please place it in the mailbox as soon as possible.  Your physician should have your test results approximately 7 days after the monitor has been mailed back to [facility].  Call [facility] Customer Care at [phone] if you have questions regarding your ZIO XT patch monitor. Call them immediately if you see an orange light blinking on your monitor.  If your monitor falls off in less than 4 days, contact our Monitor department at [phone]. ?If your monitor becomes loose or falls  off after 4 days call [employer] at [phone] for suggestions on securing your monitor.?    Follow-Up: At [facility], you and your health needs are our priority.  As part of our continuing mission to provide you with exceptional heart care, we have created designated Provider Care Teams.  These Care Teams include your primary Cardiologist (physician) and Advanced Practice Providers (APPs -  Physician Assistants and Nurse Practitioners) who all work together to provide you with the care you need, when you need it.  We recommend signing up for the patient portal called "MyChart".  Sign up information is provided on this After Visit Summary.  MyChart is used to connect with patients for Virtual Visits (Telemedicine).  Patients are able to view lab/test results, encounter notes, upcoming appointments, etc.  Non-urgent messages can be sent to your provider as well.   To learn more about what you can do with MyChart, go to [email or web address].    Your next appointment:   3 month(s)  Provider:   Dr. Bjorn Pippin

## 2022-06-06 DIAGNOSIS — R002 Palpitations: Secondary | ICD-10-CM | POA: Diagnosis not present

## 2022-06-11 ENCOUNTER — Telehealth (HOSPITAL_COMMUNITY): Payer: Self-pay | Admitting: *Deleted

## 2022-06-11 NOTE — Telephone Encounter (Signed)
Instructions left on voice mail for upcoming stress test.  Kristin Sellers

## 2022-06-18 ENCOUNTER — Ambulatory Visit (HOSPITAL_COMMUNITY): Payer: 59 | Attending: Cardiology

## 2022-06-18 DIAGNOSIS — R0609 Other forms of dyspnea: Secondary | ICD-10-CM | POA: Diagnosis not present

## 2022-06-18 LAB — EXERCISE TOLERANCE TEST
Angina Index: 0
Duke Treadmill Score: 10
Estimated workload: 10.9
Exercise duration (min): 9 min
Exercise duration (sec): 32 s
MPHR: 182 {beats}/min
Peak HR: 181 {beats}/min
Percent HR: 99 %
Rest HR: 115 {beats}/min
ST Depression (mm): 0 mm

## 2022-06-19 DIAGNOSIS — R002 Palpitations: Secondary | ICD-10-CM | POA: Diagnosis not present

## 2022-07-10 ENCOUNTER — Ambulatory Visit: Payer: 59 | Admitting: Podiatry

## 2022-07-22 ENCOUNTER — Ambulatory Visit (INDEPENDENT_AMBULATORY_CARE_PROVIDER_SITE_OTHER): Payer: 59 | Admitting: Podiatry

## 2022-07-22 ENCOUNTER — Encounter: Payer: Self-pay | Admitting: Podiatry

## 2022-07-22 DIAGNOSIS — M778 Other enthesopathies, not elsewhere classified: Secondary | ICD-10-CM | POA: Diagnosis not present

## 2022-07-22 NOTE — Progress Notes (Signed)
She presents today for follow-up of her capsulitis first metatarsophalangeal joint of the right foot.  States that she is at least 50% improved and states that it feels worse when she is barefoot or wearing flip-flops or sandals.  She is referring to the first metatarsophalangeal joint of that right foot.  Objective: Vital signs are stable she is alert and oriented x 3.  Pulses are palpable.  She has no reproducible pain and there is no swelling today.  The majority of her symptoms are across the medial aspect of the first metatarsophalangeal joint and the dorsal and dorsal lateral aspect of the joint most consistent with biomechanical hallux limitus.  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  Plan: Schedule her to have a set of orthotics made.  The orthotics will be made for Planter fasciitis and will have a met pad in the forefoot bilaterally.

## 2022-08-08 ENCOUNTER — Other Ambulatory Visit: Payer: Self-pay | Admitting: Family Medicine

## 2022-08-08 DIAGNOSIS — E559 Vitamin D deficiency, unspecified: Secondary | ICD-10-CM

## 2022-08-18 ENCOUNTER — Other Ambulatory Visit: Payer: 59

## 2022-09-03 ENCOUNTER — Encounter: Payer: Self-pay | Admitting: Cardiology

## 2022-09-03 ENCOUNTER — Ambulatory Visit: Payer: 59 | Attending: Cardiology | Admitting: Cardiology

## 2022-09-03 ENCOUNTER — Other Ambulatory Visit: Payer: Self-pay

## 2022-09-03 VITALS — BP 106/70 | HR 100 | Ht 62.0 in | Wt 185.6 lb

## 2022-09-03 DIAGNOSIS — R002 Palpitations: Secondary | ICD-10-CM | POA: Diagnosis not present

## 2022-09-03 DIAGNOSIS — E785 Hyperlipidemia, unspecified: Secondary | ICD-10-CM | POA: Diagnosis not present

## 2022-09-03 DIAGNOSIS — R0609 Other forms of dyspnea: Secondary | ICD-10-CM | POA: Diagnosis not present

## 2022-09-03 MED ORDER — METOPROLOL TARTRATE 25 MG PO TABS
ORAL_TABLET | ORAL | 5 refills | Status: DC
  Filled 2022-09-03: qty 60, 60d supply, fill #0

## 2022-09-03 NOTE — Patient Instructions (Signed)
Medication Instructions:  Your physician recommends that you continue on your current medications as directed. Please refer to the Current Medication list given to you today.  *If you need a refill on your cardiac medications before your next appointment, please call your pharmacy*  Follow-Up: At [facility], you and your health needs are our priority.  As part of our continuing mission to provide you with exceptional heart care, we have created designated Provider Care Teams.  These Care Teams include your primary Cardiologist (physician) and Advanced Practice Providers (APPs -  Physician Assistants and Nurse Practitioners) who all work together to provide you with the care you need, when you need it.  We recommend signing up for the patient portal called "MyChart".  Sign up information is provided on this After Visit Summary.  MyChart is used to connect with patients for Virtual Visits (Telemedicine).  Patients are able to view lab/test results, encounter notes, upcoming appointments, etc.  Non-urgent messages can be sent to your provider as well.   To learn more about what you can do with MyChart, go to [email or web address].    Your next appointment:   12 month(s)  Provider:   Dr. Schumann  

## 2022-09-03 NOTE — Progress Notes (Signed)
Cardiology Office Note:    Date:  09/03/2022   ID:  Kristin Sellers, DOB 04-26-1985, MRN [id number]  PCP:  Melida Quitter, PA  Cardiologist:  None  Electrophysiologist:  None   Referring MD: Melida Quitter, PA   Chief Complaint  Patient presents with   Palpitations    History of Present Illness:    Kristin Sellers is a 37 y.o. female with a hx of hyperlipidemia, pregnancy-induced hypertension who was referred by Saralyn Pilar, PA for evaluation of palpitations.  She reports she has had palpitations for 15 years.  Wore Holter monitor 15 years ago.  Has been on as needed Lopressor.  Recently though palpitations have gotten worse.  Now happening multiple times a day, sometimes lasting few seconds but sometimes can last up to few minutes.  Feels like it is brought on by exertion.  Heart feels like it is irregular and racing during episodes.  Occurs with minimal exertion.  Also reports feeling short of breath with exertion.  She denies any chest pain.  Denies any lightheadedness, syncope, or lower extremity edema.  No smoking history.  Family history includes father had CABG in 35s, paternal grandfather died of MI in 82s.  Mother has had SVT ablation.  Echocardiogram 02/2016 while pregnant showed EF 60 to 65%, normal RV, grade 2 diastolic dysfunction, no significant valvular disease.    ETT 06/18/2022 showed good exercise capacity (achieved 10.9 METS), no evidence of ischemia.  Zio patch x 7 days 05/2022 showed no significant arrhythmias.  Since last clinic visit, she reports she is doing okay.  She continues to have palpitations.  Primarily occurs with exertion.  She has not been exercising.   Past Medical History:  Diagnosis Date   Allergy    Asthma    as a child - no problems as adult - no inhaler   Bronchitis 10/2015   tx with proair inhaler   Heart murmur    no problems ever   History of Bell's palsy    History of pre-eclampsia    Hyperlipidemia    diet controlled - no  med   Palpitations    hx with prior pregnancy   Pregnancy induced hypertension    Hx GHTN - resolved after 03/2016 delivery   SVD (spontaneous vaginal delivery)    x 3    Past Surgical History:  Procedure Laterality Date   LAPAROSCOPIC BILATERAL SALPINGECTOMY Bilateral 06/12/2016   Procedure: LAPAROSCOPIC BILATERAL SALPINGECTOMY;  Surgeon: Waynard Reeds, MD;  Location: [facility];  Service: Gynecology;  Laterality: Bilateral;   MOUTH SURGERY     wisdom teeth ext   tubes in ears     as a child    Current Medications: Current Meds  Medication Sig   levonorgestrel (MIRENA, 52 MG,) 20 MCG/DAY IUD 1 each by Intrauterine route once.   meloxicam (MOBIC) 15 MG tablet Take 1 tablet (15 mg total) by mouth daily.   Vitamin D, Ergocalciferol, (DRISDOL) 1.25 MG (50000 UNIT) CAPS capsule TAKE 1 CAPSULE (50,000 UNITS TOTAL) BY MOUTH EVERY 7 (SEVEN) DAYS     Allergies:   Sulfa antibiotics   Social History   Socioeconomic History   Marital status: Married    Spouse name: Not on file   Number of children: 2   Years of education: Not on file   Highest education level: Not on file  Occupational History   Occupation: Charity fundraiser    Comment: Diplomatic Services operational officer: [employer]  [employer]  Tobacco Use   Smoking status: Never    Passive exposure: Never   Smokeless tobacco: Never  Substance and Sexual Activity   Alcohol use: No   Drug use: No   Sexual activity: Yes    Birth control/protection: None  Other Topics Concern   Not on file  Social History Narrative   Not on file   Social Determinants of Health   Financial Resource Strain: Not on file  Food Insecurity: Not on file  Transportation Needs: Not on file  Physical Activity: Not on file  Stress: Not on file  Social Connections: Not on file     Family History: The patient's family history includes Alcohol abuse in her father; Arthritis in her father; Asthma in her brother, sister, son, and son; COPD in her father;  Cancer in her father and maternal grandmother; Coronary artery disease in her father and another family member; Diabetes in her father and maternal grandmother; Early death in her paternal grandfather; Heart attack in her father and another family member; Heart disease in her father, mother, paternal grandmother, and sister; Hyperlipidemia in her father, mother, and paternal grandmother; Hypertension in her father, mother, paternal grandmother, and another family member. There is no history of Anesthesia problems, Hypotension, Malignant hyperthermia, or Pseudochol deficiency.  ROS:   Please see the history of present illness.     All other systems reviewed and are negative.  EKGs/Labs/Other Studies Reviewed:    The following studies were reviewed today:   EKG:   06/03/2022: Normal sinus rhythm, rate 88, no ST abnormality  Recent Labs: 05/08/2022: ALT 18; BUN 10; Creatinine, Ser 0.74; Hemoglobin 13.7; Platelets 300; Potassium 4.3; Sodium 139; TSH 3.490  Recent Lipid Panel    Component Value Date/Time   CHOL 206 (H) 05/08/2022 1104   TRIG 80 05/08/2022 1104   HDL 46 05/08/2022 1104   CHOLHDL 4.5 (H) 05/08/2022 1104   CHOLHDL 4 05/31/2019 1109   VLDL 14.6 05/31/2019 1109   LDLCALC 146 (H) 05/08/2022 1104    Physical Exam:    VS:  BP 106/70   Pulse 100   Ht 5\' 2"  (1.575 m)   Wt 185 lb 9.6 oz (84.2 kg)   SpO2 98%   BMI 33.95 kg/m     Wt Readings from Last 3 Encounters:  09/03/22 185 lb 9.6 oz (84.2 kg)  06/03/22 182 lb (82.6 kg)  05/08/22 184 lb (83.5 kg)     GEN:  Well nourished, well developed in no acute distress HEENT: Normal NECK: No JVD; No carotid bruits LYMPHATICS: No lymphadenopathy CARDIAC: RRR, no murmurs, rubs, gallops RESPIRATORY:  Clear to auscultation without rales, wheezing or rhonchi  ABDOMEN: Soft, non-tender, non-distended MUSCULOSKELETAL:  No edema; No deformity  SKIN: Warm and dry NEUROLOGIC:  Alert and oriented x 3 PSYCHIATRIC:  Normal affect    ASSESSMENT:    1. Palpitations   2. DOE (dyspnea on exertion)   3. Hyperlipidemia, unspecified hyperlipidemia type     PLAN:    Palpitations: Zio patch x 7 days 05/2022 showed no significant arrhythmias.  No exercise-induced arrhythmias on stress test -She continues to have palpitations, reports symptoms improved with as needed metoprolol in the past.  Will prescribe Lopressor 12.5 mg twice daily as needed for palpitations  DOE: Low suspicion for CAD given age but does have significant family history.  ETT 06/18/2022 showed good exercise capacity (achieved 10.9 METS), no evidence of ischemia.    Hyperlipidemia: LDL 146 on 05/08/2022.  Diet/exercise encouraged  RTC in 1 year  Medication Adjustments/Labs and Tests Ordered: Current medicines are reviewed at length with the patient today.  Concerns regarding medicines are outlined above.  No orders of the defined types were placed in this encounter.  Meds ordered this encounter  Medications   metoprolol tartrate (LOPRESSOR) 25 MG tablet    Sig: TKE 1/2 TABLET BY MOUTH TWO TIMES DAILY AS NEEDED FOR PALPITATIONS    Dispense:  60 tablet    Refill:  5    Patient Instructions  Medication Instructions:  Your physician recommends that you continue on your current medications as directed. Please refer to the Current Medication list given to you today.  *If you need a refill on your cardiac medications before your next appointment, please call your pharmacy*   Follow-Up: At [facility], you and your health needs are our priority.  As part of our continuing mission to provide you with exceptional heart care, we have created designated Provider Care Teams.  These Care Teams include your primary Cardiologist (physician) and Advanced Practice Providers (APPs -  Physician Assistants and Nurse Practitioners) who all work together to provide you with the care you need, when you need it.  We recommend signing up for the patient portal  called "MyChart".  Sign up information is provided on this After Visit Summary.  MyChart is used to connect with patients for Virtual Visits (Telemedicine).  Patients are able to view lab/test results, encounter notes, upcoming appointments, etc.  Non-urgent messages can be sent to your provider as well.   To learn more about what you can do with MyChart, go to [email or web address].    Your next appointment:   12 month(s)  Provider:   Dr. Bjorn Pippin   Signed, Little Ishikawa, MD  09/03/2022 3:16 PM    [facility]

## 2022-09-05 ENCOUNTER — Other Ambulatory Visit (HOSPITAL_COMMUNITY): Payer: Self-pay

## 2022-09-07 ENCOUNTER — Telehealth: Payer: 59 | Admitting: Physician Assistant

## 2022-09-07 DIAGNOSIS — R3989 Other symptoms and signs involving the genitourinary system: Secondary | ICD-10-CM

## 2022-09-07 MED ORDER — NITROFURANTOIN MONOHYD MACRO 100 MG PO CAPS
100.0000 mg | ORAL_CAPSULE | Freq: Two times a day (BID) | ORAL | 0 refills | Status: DC
Start: 2022-09-07 — End: 2022-11-10
  Filled 2022-09-07: qty 10, 5d supply, fill #0

## 2022-09-07 NOTE — Progress Notes (Signed)

## 2022-09-08 ENCOUNTER — Other Ambulatory Visit: Payer: Self-pay

## 2022-09-19 ENCOUNTER — Encounter: Payer: Self-pay | Admitting: Family Medicine

## 2022-09-19 DIAGNOSIS — F411 Generalized anxiety disorder: Secondary | ICD-10-CM

## 2022-09-22 MED ORDER — SERTRALINE HCL 25 MG PO TABS
25.0000 mg | ORAL_TABLET | Freq: Every day | ORAL | 3 refills | Status: DC
Start: 2022-09-22 — End: 2022-10-28

## 2022-10-15 DIAGNOSIS — Z01419 Encounter for gynecological examination (general) (routine) without abnormal findings: Secondary | ICD-10-CM | POA: Diagnosis not present

## 2022-10-15 DIAGNOSIS — Z124 Encounter for screening for malignant neoplasm of cervix: Secondary | ICD-10-CM | POA: Diagnosis not present

## 2022-10-15 LAB — HM PAP SMEAR: HM Pap smear: NEGATIVE

## 2022-10-22 ENCOUNTER — Other Ambulatory Visit: Payer: Self-pay | Admitting: Family Medicine

## 2022-10-22 DIAGNOSIS — E782 Mixed hyperlipidemia: Secondary | ICD-10-CM

## 2022-10-22 DIAGNOSIS — E559 Vitamin D deficiency, unspecified: Secondary | ICD-10-CM

## 2022-10-28 ENCOUNTER — Other Ambulatory Visit: Payer: Self-pay | Admitting: Family Medicine

## 2022-10-28 ENCOUNTER — Encounter: Payer: Self-pay | Admitting: Family Medicine

## 2022-10-28 DIAGNOSIS — E559 Vitamin D deficiency, unspecified: Secondary | ICD-10-CM

## 2022-10-28 MED ORDER — SERTRALINE HCL 50 MG PO TABS: 50.0000 mg | ORAL_TABLET | Freq: Every day | ORAL | 3 refills | Status: DC

## 2022-11-03 ENCOUNTER — Other Ambulatory Visit: Payer: 59

## 2022-11-03 DIAGNOSIS — E782 Mixed hyperlipidemia: Secondary | ICD-10-CM | POA: Diagnosis not present

## 2022-11-03 DIAGNOSIS — E559 Vitamin D deficiency, unspecified: Secondary | ICD-10-CM

## 2022-11-04 LAB — LIPID PANEL
Chol/HDL Ratio: 4.7 ratio — ABNORMAL HIGH (ref 0.0–4.4)
Cholesterol, Total: 230 mg/dL — ABNORMAL HIGH (ref 100–199)
HDL: 49 mg/dL (ref >39)
LDL Chol Calc (NIH): 161 mg/dL — ABNORMAL HIGH (ref 0–99)
Triglycerides: 109 mg/dL (ref 0–149)
VLDL Cholesterol Cal: 20 mg/dL (ref 5–40)

## 2022-11-04 LAB — VITAMIN D 25 HYDROXY (VIT D DEFICIENCY, FRACTURES): Vit D, 25-Hydroxy: 32.1 ng/mL (ref 30.0–100.0)

## 2022-11-10 ENCOUNTER — Ambulatory Visit (INDEPENDENT_AMBULATORY_CARE_PROVIDER_SITE_OTHER): Payer: 59 | Admitting: Family Medicine

## 2022-11-10 ENCOUNTER — Encounter: Payer: Self-pay | Admitting: Family Medicine

## 2022-11-10 VITALS — BP 126/85 | HR 72 | Resp 20 | Ht 62.0 in | Wt 184.0 lb

## 2022-11-10 DIAGNOSIS — E559 Vitamin D deficiency, unspecified: Secondary | ICD-10-CM | POA: Diagnosis not present

## 2022-11-10 DIAGNOSIS — E782 Mixed hyperlipidemia: Secondary | ICD-10-CM | POA: Diagnosis not present

## 2022-11-10 DIAGNOSIS — Z0000 Encounter for general adult medical examination without abnormal findings: Secondary | ICD-10-CM | POA: Diagnosis not present

## 2022-11-10 DIAGNOSIS — F411 Generalized anxiety disorder: Secondary | ICD-10-CM | POA: Diagnosis not present

## 2022-11-10 NOTE — Assessment & Plan Note (Signed)
Continue Zoloft 50 mg daily.  She will send me a MyChart message to let me know if she would like to continue on this dose or if we need to make any adjustments.

## 2022-11-10 NOTE — Assessment & Plan Note (Addendum)
Last lipid panel: LDL 161, HDL 49, triglycerides 109.  Recheck lipid panel in 6 months.  Cardiology is also planning on doing a calcium deposit scan once she turns 39.

## 2022-11-10 NOTE — Assessment & Plan Note (Signed)
Continue over-the-counter vitamin D supplement

## 2022-11-10 NOTE — Patient Instructions (Signed)
Let me know what you decide after a few more weeks of the Zoloft 50 mg dose and I will make sure that you have refills available!

## 2022-11-10 NOTE — Progress Notes (Signed)
Complete physical exam  Patient: Kristin Sellers   DOB: 1985-08-24   37 y.o. Female  MRN: [id number]  Subjective:    Chief Complaint  Patient presents with   Annual Exam    Kristin Sellers is a 37 y.o. female who presents today for a complete physical exam. She reports consuming a general diet.  She generally feels well. She reports sleeping well. She does not have additional problems to discuss today.  She has been taking 50 mg of Zoloft for a few weeks now and has not noticed much difference but will give it the full 6 weeks before deciding if the dose needs to be changed or not.   Most recent fall risk assessment:    05/08/2022   10:38 AM  Fall Risk   Falls in the past year? 0  Number falls in past yr: 0  Injury with Fall? 0  Risk for fall due to : No Fall Risks     Most recent depression and anxiety screenings:    11/10/2022    8:39 AM 05/31/2019   10:30 AM  PHQ 2/9 Scores  PHQ - 2 Score 0 0  PHQ- 9 Score 3 0      11/10/2022    8:40 AM 05/31/2019   10:30 AM  GAD 7 : Generalized Anxiety Score  Nervous, Anxious, on Edge 0 0  Control/stop worrying 0 0  Worry too much - different things 1 0  Trouble relaxing 1 0  Restless 0 0  Easily annoyed or irritable 1 0  Afraid - awful might happen 0 0  Total GAD 7 Score 3 0  Anxiety Difficulty Somewhat difficult Not difficult at all    Patient Active Problem List   Diagnosis Date Noted   GAD (generalized anxiety disorder) 11/10/2022   HLD (hyperlipidemia) 06/19/2020   IDA (iron deficiency anemia) 03/23/2020   Vitamin D deficiency 11/29/2013   Overweight 11/29/2013   Bell palsy 04/24/2012   Heart palpitations 10/04/2010    Past Surgical History:  Procedure Laterality Date   LAPAROSCOPIC BILATERAL SALPINGECTOMY Bilateral 06/12/2016   Procedure: LAPAROSCOPIC BILATERAL SALPINGECTOMY;  Surgeon: Waynard Reeds, MD;  Location: [facility];  Service: Gynecology;  Laterality: Bilateral;   MOUTH SURGERY     wisdom teeth ext    tubes in ears     as a child   Social History   Tobacco Use   Smoking status: Never    Passive exposure: Never   Smokeless tobacco: Never  Substance Use Topics   Alcohol use: No   Drug use: No   Family History  Problem Relation Age of Onset   Heart disease Mother    Hyperlipidemia Mother    Hypertension Mother    Heart disease Father    Hyperlipidemia Father    Hypertension Father    Coronary artery disease Father    Heart attack Father    Alcohol abuse Father    Arthritis Father    Cancer Father    COPD Father    Diabetes Father    Early death Paternal Grandfather    Coronary artery disease Other    Hypertension Other    Heart attack Other    Asthma Sister    Asthma Brother    Asthma Son    Cancer Maternal Grandmother    Diabetes Maternal Grandmother    Heart disease Sister    Asthma Son    Heart disease Paternal Grandmother    Hyperlipidemia Paternal Grandmother  Hypertension Paternal Grandmother    Anesthesia problems Neg Hx    Hypotension Neg Hx    Malignant hyperthermia Neg Hx    Pseudochol deficiency Neg Hx    Allergies  Allergen Reactions   Sulfa Antibiotics Hives     Patient Care Team: Melida Quitter, PA as PCP - General (Family Medicine)   Outpatient Medications Prior to Visit  Medication Sig   levonorgestrel (MIRENA, 52 MG,) 20 MCG/DAY IUD 1 each by Intrauterine route once.   meloxicam (MOBIC) 15 MG tablet Take 1 tablet (15 mg total) by mouth daily.   metoprolol tartrate (LOPRESSOR) 25 MG tablet TKE 1/2 TABLET BY MOUTH TWO TIMES DAILY AS NEEDED FOR PALPITATIONS   sertraline (ZOLOFT) 50 MG tablet Take 1 tablet (50 mg total) by mouth daily.   [DISCONTINUED] nitrofurantoin, macrocrystal-monohydrate, (MACROBID) 100 MG capsule Take 1 capsule (100 mg total) by mouth 2 (two) times daily.   [DISCONTINUED] Vitamin D, Ergocalciferol, (DRISDOL) 1.25 MG (50000 UNIT) CAPS capsule TAKE 1 CAPSULE (50,000 UNITS TOTAL) BY MOUTH EVERY 7 (SEVEN) DAYS   No  facility-administered medications prior to visit.    Review of Systems  Constitutional:  Negative for chills, fever and malaise/fatigue.  HENT:  Negative for congestion and hearing loss.   Eyes:  Negative for blurred vision and double vision.  Respiratory:  Negative for cough and shortness of breath.   Cardiovascular:  Negative for chest pain, palpitations and leg swelling.  Gastrointestinal:  Negative for abdominal pain, constipation, diarrhea and heartburn.  Genitourinary:  Negative for frequency and urgency.  Musculoskeletal:  Negative for myalgias and neck pain.  Neurological:  Negative for headaches.  Endo/Heme/Allergies:  Negative for polydipsia.  Psychiatric/Behavioral:  Negative for depression. The patient is not nervous/anxious and does not have insomnia.       Objective:    BP 126/85 (BP Location: Left Arm, Patient Position: Sitting, Cuff Size: Normal)   Pulse 72   Resp 20   Ht 5\' 2"  (1.575 m)   Wt 184 lb (83.5 kg)   SpO2 99%   BMI 33.65 kg/m    Physical Exam Constitutional:      General: She is not in acute distress.    Appearance: Normal appearance.  HENT:     Head: Normocephalic and atraumatic.     Right Ear: External ear normal. There is impacted cerumen.     Left Ear: External ear normal. There is impacted cerumen.     Nose: Nose normal.     Mouth/Throat:     Mouth: Mucous membranes are moist.     Pharynx: No oropharyngeal exudate or posterior oropharyngeal erythema.  Eyes:     Extraocular Movements: Extraocular movements intact.     Conjunctiva/sclera: Conjunctivae normal.     Pupils: Pupils are equal, round, and reactive to light.  Neck:     Thyroid: No thyroid mass, thyromegaly or thyroid tenderness.  Cardiovascular:     Rate and Rhythm: Normal rate and regular rhythm.     Heart sounds: Normal heart sounds. No murmur heard.    No friction rub. No gallop.  Pulmonary:     Effort: Pulmonary effort is normal. No respiratory distress.     Breath  sounds: Normal breath sounds. No wheezing, rhonchi or rales.  Abdominal:     General: Abdomen is flat. Bowel sounds are normal. There is no distension.     Palpations: There is no mass.     Tenderness: There is no abdominal tenderness. There is no guarding.  Musculoskeletal:        General: Normal range of motion.     Cervical back: Normal range of motion and neck supple.  Lymphadenopathy:     Cervical: No cervical adenopathy.  Skin:    General: Skin is warm and dry.  Neurological:     Mental Status: She is alert and oriented to person, place, and time.     Cranial Nerves: No cranial nerve deficit.     Motor: No weakness.     Deep Tendon Reflexes: Reflexes normal.  Psychiatric:        Mood and Affect: Mood normal.       Assessment & Plan:    Routine Health Maintenance and Physical Exam  Immunization History  Administered Date(s) Administered   DTaP 12/04/2002   Influenza Split 11/27/2014   Influenza,inj,Quad PF,6+ Mos 11/17/2016   Influenza-Unspecified 11/18/2018, 12/02/2018, 11/18/2019, 11/17/2021   PFIZER(Purple Top)SARS-COV-2 Vaccination 03/27/2019, 04/17/2019   Td 12/03/2012   Tdap 01/09/2016    Health Maintenance  Topic Date Due   COVID-19 Vaccine (3 - 2023-24 season) 11/26/2022 (Originally 10/19/2022)   INFLUENZA VACCINE  05/18/2023 (Originally 09/18/2022)   Cervical Cancer Screening (HPV/Pap Cotest)  01/17/2024   DTaP/Tdap/Td (4 - Td or Tdap) 01/08/2026   Hepatitis C Screening  Completed   HIV Screening  Completed   HPV VACCINES  Aged Out    Reviewed most recent labs including CBC, CMP, lipid panel, A1C, TSH, and vitamin D. All within normal limits/stable from last check other than LDL increased to 161 from 161, vitamin D low normal so continue vitamin D supplement. Pap smear was recently updated, we will request records from [facility]. She will get a flu shot at work with [employer].  Discussed health benefits of physical activity, and encouraged her  to engage in regular exercise appropriate for her age and condition.  Wellness examination  Mixed hyperlipidemia Assessment & Plan: Last lipid panel: LDL 161, HDL 49, triglycerides 109.  Recheck lipid panel in 6 months.  Cardiology is also planning on doing a calcium deposit scan once she turns 39.   Vitamin D deficiency Assessment & Plan: Continue over-the-counter vitamin D supplement.   GAD (generalized anxiety disorder) Assessment & Plan: Continue Zoloft 50 mg daily.  She will send me a MyChart message to let me know if she would like to continue on this dose or if we need to make any adjustments.     Return in about 6 months (around 05/10/2023) for follow-up for HLD, fasting blood work 1 week before.     Melida Quitter, PA

## 2022-11-12 ENCOUNTER — Telehealth: Payer: 59 | Admitting: Physician Assistant

## 2022-11-12 DIAGNOSIS — B9789 Other viral agents as the cause of diseases classified elsewhere: Secondary | ICD-10-CM | POA: Diagnosis not present

## 2022-11-12 DIAGNOSIS — J019 Acute sinusitis, unspecified: Secondary | ICD-10-CM

## 2022-11-12 MED ORDER — PREDNISONE 10 MG (21) PO TBPK
ORAL_TABLET | ORAL | 0 refills | Status: DC
Start: 2022-11-12 — End: 2022-12-02

## 2022-11-12 NOTE — Progress Notes (Signed)
E-Visit for Sinus Problems  We are sorry that you are not feeling well.  Here is how we plan to help!  Based on what you have shared with me it looks like you have sinusitis.  Sinusitis is inflammation and infection in the sinus cavities of the head.  Based on your presentation I believe you most likely have Acute Viral Sinusitis.This is an infection most likely caused by a virus. There is not specific treatment for viral sinusitis other than to help you with the symptoms until the infection runs its course.  You may use an oral decongestant such as Mucinex D or if you have glaucoma or high blood pressure use plain Mucinex. Saline nasal spray help and can safely be used as often as needed for congestion, I have prescribed: a steroid pack to use as directed.  Some authorities believe that zinc sprays or the use of Echinacea may shorten the course of your symptoms.  Sinus infections are not as easily transmitted as other respiratory infection, however we still recommend that you avoid close contact with loved ones, especially the very young and elderly.  Remember to wash your hands thoroughly throughout the day as this is the number one way to prevent the spread of infection!  Home Care: Only take medications as instructed by your medical team. Do not take these medications with alcohol. A steam or ultrasonic humidifier can help congestion.  You can place a towel over your head and breathe in the steam from hot water coming from a faucet. Avoid close contacts especially the very young and the elderly. Cover your mouth when you cough or sneeze. Always remember to wash your hands.  Get Help Right Away If: You develop worsening fever or sinus pain. You develop a severe head ache or visual changes. Your symptoms persist after you have completed your treatment plan.  Make sure you Understand these instructions. Will watch your condition. Will get help right away if you are not doing well or get  worse.   Thank you for choosing an e-visit.  Your e-visit answers were reviewed by a board certified advanced clinical practitioner to complete your personal care plan. Depending upon the condition, your plan could have included both over the counter or prescription medications.  Please review your pharmacy choice. Make sure the pharmacy is open so you can pick up prescription now. If there is a problem, you may contact your provider through [employer] and have the prescription routed to another pharmacy.  Your safety is important to [place]. If you have drug allergies check your prescription carefully.   For the next 24 hours you can use MyChart to ask questions about today's visit, request a non-urgent call back, or ask for a work or school excuse. You will get an email in the next two days asking about your experience. I hope that your e-visit has been valuable and will speed your recovery.

## 2022-11-12 NOTE — Progress Notes (Signed)
I have spent 5 minutes in review of e-visit questionnaire, review and updating patient chart, medical decision making and response to patient.   Mia Milan Cody Jacklynn Dehaas, PA-C    

## 2022-12-02 ENCOUNTER — Other Ambulatory Visit: Payer: Self-pay

## 2022-12-02 ENCOUNTER — Telehealth: Payer: 59 | Admitting: Physician Assistant

## 2022-12-02 DIAGNOSIS — R3989 Other symptoms and signs involving the genitourinary system: Secondary | ICD-10-CM | POA: Diagnosis not present

## 2022-12-02 MED ORDER — CEPHALEXIN 500 MG PO CAPS
500.0000 mg | ORAL_CAPSULE | Freq: Two times a day (BID) | ORAL | 0 refills | Status: AC
  Filled 2022-12-02: qty 14, 7d supply, fill #0

## 2022-12-02 NOTE — Progress Notes (Signed)
I have spent 5 minutes in review of e-visit questionnaire, review and updating patient chart, medical decision making and response to patient.   Mia Milan Cody Jacklynn Dehaas, PA-C    

## 2022-12-02 NOTE — Progress Notes (Signed)

## 2022-12-23 ENCOUNTER — Encounter: Payer: Self-pay | Admitting: Family Medicine

## 2022-12-23 DIAGNOSIS — F411 Generalized anxiety disorder: Secondary | ICD-10-CM

## 2022-12-23 MED ORDER — SERTRALINE HCL 100 MG PO TABS
100.0000 mg | ORAL_TABLET | Freq: Every day | ORAL | 3 refills | Status: DC
Start: 2022-12-23 — End: 2023-04-28

## 2023-01-12 ENCOUNTER — Other Ambulatory Visit: Payer: Self-pay

## 2023-01-12 ENCOUNTER — Telehealth: Payer: 59 | Admitting: Physician Assistant

## 2023-01-12 DIAGNOSIS — M5441 Lumbago with sciatica, right side: Secondary | ICD-10-CM | POA: Diagnosis not present

## 2023-01-12 MED ORDER — CYCLOBENZAPRINE HCL 10 MG PO TABS
5.0000 mg | ORAL_TABLET | Freq: Three times a day (TID) | ORAL | 0 refills | Status: DC | PRN
Start: 2023-01-12 — End: 2023-09-13
  Filled 2023-01-12: qty 30, 10d supply, fill #0

## 2023-01-12 MED ORDER — METHYLPREDNISOLONE 4 MG PO TBPK
ORAL_TABLET | ORAL | 0 refills | Status: DC
Start: 2023-01-12 — End: 2023-06-28
  Filled 2023-01-12: qty 21, 6d supply, fill #0

## 2023-01-12 NOTE — Progress Notes (Signed)
E-Visit for Back Pain   We are sorry that you are not feeling well.  Here is how we plan to help!  Based on what you have shared with me it looks like you mostly have acute back pain.  Acute back pain is defined as musculoskeletal pain that can resolve in 1-3 weeks with conservative treatment.  I have prescribed Medrol dose pack, a steroid anti-inflammatory, as well as Flexeril 10 mg every eight hours as needed which is a muscle relaxer.  Some patients experience stomach irritation or in increased heartburn with anti-inflammatory drugs.  Please keep in mind that muscle relaxer's can cause fatigue and should not be taken while at work or driving.  Back pain is very common.  The pain often gets better over time.  The cause of back pain is usually not dangerous.  Most people can learn to manage their back pain on their own.  Home Care Stay active.  Start with short walks on flat ground if you can.  Try to walk farther each day. Do not sit, drive or stand in one place for more than 30 minutes.  Do not stay in bed. Do not avoid exercise or work.  Activity can help your back heal faster. Be careful when you bend or lift an object.  Bend at your knees, keep the object close to you, and do not twist. Sleep on a firm mattress.  Lie on your side, and bend your knees.  If you lie on your back, put a pillow under your knees. Only take medicines as told by your doctor. Put ice on the injured area. Put ice in a plastic bag Place a towel between your skin and the bag Leave the ice on for 15-20 minutes, 3-4 times a day for the first 2-3 days. 210 After that, you can switch between ice and heat packs. Ask your doctor about back exercises or massage. Avoid feeling anxious or stressed.  Find good ways to deal with stress, such as exercise.  Get Help Right Way If: Your pain does not go away with rest or medicine. Your pain does not go away in 1 week. You have new problems. You do not feel well. The pain  spreads into your legs. You cannot control when you poop (bowel movement) or pee (urinate) You feel sick to your stomach (nauseous) or throw up (vomit) You have belly (abdominal) pain. You feel like you may pass out (faint). If you develop a fever.  Make Sure you: Understand these instructions. Will watch your condition Will get help right away if you are not doing well or get worse.  Your e-visit answers were reviewed by a board certified advanced clinical practitioner to complete your personal care plan.  Depending on the condition, your plan could have included both over the counter or prescription medications.  If there is a problem please reply  once you have received a response from your provider.  Your safety is important to [place].  If you have drug allergies check your prescription carefully.    You can use MyChart to ask questions about today's visit, request a non-urgent call back, or ask for a work or school excuse for 24 hours related to this e-Visit. If it has been greater than 24 hours you will need to follow up with your provider, or enter a new e-Visit to address those concerns.  You will get an e-mail in the next two days asking about your experience.  I hope that your  e-visit has been valuable and will speed your recovery. Thank you for using e-visits.  I have spent 5 minutes in review of e-visit questionnaire, review and updating patient chart, medical decision making and response to patient.   Margaretann Loveless, PA-C

## 2023-02-16 ENCOUNTER — Encounter: Payer: Self-pay | Admitting: Family Medicine

## 2023-03-26 ENCOUNTER — Encounter: Payer: Self-pay | Admitting: Family Medicine

## 2023-04-14 ENCOUNTER — Telehealth: Payer: 59 | Admitting: Physician Assistant

## 2023-04-14 ENCOUNTER — Encounter: Payer: Self-pay | Admitting: Oncology

## 2023-04-14 ENCOUNTER — Other Ambulatory Visit: Payer: Self-pay

## 2023-04-14 DIAGNOSIS — J019 Acute sinusitis, unspecified: Secondary | ICD-10-CM

## 2023-04-14 DIAGNOSIS — B9689 Other specified bacterial agents as the cause of diseases classified elsewhere: Secondary | ICD-10-CM | POA: Diagnosis not present

## 2023-04-14 MED ORDER — AMOXICILLIN-POT CLAVULANATE 875-125 MG PO TABS
1.0000 | ORAL_TABLET | Freq: Two times a day (BID) | ORAL | 0 refills | Status: DC
Start: 2023-04-14 — End: 2023-06-28
  Filled 2023-04-14: qty 14, 7d supply, fill #0

## 2023-04-14 NOTE — Progress Notes (Signed)

## 2023-04-28 ENCOUNTER — Other Ambulatory Visit: Payer: Self-pay | Admitting: Family Medicine

## 2023-04-28 DIAGNOSIS — F411 Generalized anxiety disorder: Secondary | ICD-10-CM

## 2023-04-30 ENCOUNTER — Telehealth: Payer: Self-pay | Admitting: *Deleted

## 2023-04-30 NOTE — Telephone Encounter (Signed)
 LVM for pt to call to see about scheduling a f/u after lab visit.

## 2023-05-05 ENCOUNTER — Other Ambulatory Visit: Payer: 59

## 2023-05-06 ENCOUNTER — Encounter: Payer: Self-pay | Admitting: Oncology

## 2023-05-11 ENCOUNTER — Ambulatory Visit: Payer: 59 | Admitting: Family Medicine

## 2023-05-12 ENCOUNTER — Other Ambulatory Visit

## 2023-05-12 DIAGNOSIS — E782 Mixed hyperlipidemia: Secondary | ICD-10-CM | POA: Diagnosis not present

## 2023-05-12 DIAGNOSIS — E559 Vitamin D deficiency, unspecified: Secondary | ICD-10-CM | POA: Diagnosis not present

## 2023-05-12 DIAGNOSIS — R5383 Other fatigue: Secondary | ICD-10-CM | POA: Diagnosis not present

## 2023-05-13 LAB — LIPID PANEL
Chol/HDL Ratio: 5 ratio — ABNORMAL HIGH (ref 0.0–4.4)
Cholesterol, Total: 251 mg/dL — ABNORMAL HIGH (ref 100–199)
HDL: 50 mg/dL (ref >39)
LDL Chol Calc (NIH): 182 mg/dL — ABNORMAL HIGH (ref 0–99)
Triglycerides: 107 mg/dL (ref 0–149)
VLDL Cholesterol Cal: 19 mg/dL (ref 5–40)

## 2023-05-13 LAB — COMPREHENSIVE METABOLIC PANEL
ALT: 23 IU/L (ref 0–32)
AST: 22 IU/L (ref 0–40)
Albumin: 4.5 g/dL (ref 3.9–4.9)
Alkaline Phosphatase: 94 IU/L (ref 44–121)
BUN/Creatinine Ratio: 13 (ref 9–23)
BUN: 11 mg/dL (ref 6–20)
Bilirubin Total: 0.4 mg/dL (ref 0.0–1.2)
CO2: 23 mmol/L (ref 20–29)
Calcium: 9.1 mg/dL (ref 8.7–10.2)
Chloride: 103 mmol/L (ref 96–106)
Creatinine, Ser: 0.85 mg/dL (ref 0.57–1.00)
Globulin, Total: 2.9 g/dL (ref 1.5–4.5)
Glucose: 76 mg/dL (ref 70–99)
Potassium: 4.4 mmol/L (ref 3.5–5.2)
Sodium: 140 mmol/L (ref 134–144)
Total Protein: 7.4 g/dL (ref 6.0–8.5)
eGFR: 89 mL/min/{1.73_m2} (ref >59)

## 2023-05-13 LAB — CBC WITH DIFFERENTIAL/PLATELET
Basophils Absolute: 0 10*3/uL (ref 0.0–0.2)
Basos: 1 %
EOS (ABSOLUTE): 0.3 10*3/uL (ref 0.0–0.4)
Eos: 4 %
Hematocrit: 39.4 % (ref 34.0–46.6)
Hemoglobin: 13.1 g/dL (ref 11.1–15.9)
Immature Grans (Abs): 0 10*3/uL (ref 0.0–0.1)
Immature Granulocytes: 0 %
Lymphocytes Absolute: 2.5 10*3/uL (ref 0.7–3.1)
Lymphs: 44 %
MCH: 32 pg (ref 26.6–33.0)
MCHC: 33.2 g/dL (ref 31.5–35.7)
MCV: 96 fL (ref 79–97)
Monocytes Absolute: 0.3 10*3/uL (ref 0.1–0.9)
Monocytes: 6 %
Neutrophils Absolute: 2.6 10*3/uL (ref 1.4–7.0)
Neutrophils: 45 %
Platelets: 297 10*3/uL (ref 150–450)
RBC: 4.1 x10E6/uL (ref 3.77–5.28)
RDW: 12.5 % (ref 11.7–15.4)
WBC: 5.7 10*3/uL (ref 3.4–10.8)

## 2023-05-13 LAB — VITAMIN D 25 HYDROXY (VIT D DEFICIENCY, FRACTURES): Vit D, 25-Hydroxy: 26.3 ng/mL — ABNORMAL LOW (ref 30.0–100.0)

## 2023-05-19 ENCOUNTER — Ambulatory Visit: Admitting: Podiatry

## 2023-06-02 ENCOUNTER — Ambulatory Visit: Admitting: Family Medicine

## 2023-06-02 NOTE — Progress Notes (Deleted)
   Established Patient Office Visit  Subjective   Patient ID: Kristin Sellers, female    DOB: [birth year]  Age: 38 y.o. MRN: [id number]  No chief complaint on file.   HPI  Low vit d  LDL is 182.     The ASCVD Risk score (Arnett DK, et al., 2019) failed to calculate for the following reasons:   The 2019 ASCVD risk score is only valid for ages 49 to 32  Health Maintenance Due  Topic Date Due   Pneumococcal Vaccine 42-70 Years old (1 of 2 - PCV) Never done   COVID-19 Vaccine (3 - 2024-25 season) 10/19/2022      Objective:     There were no vitals taken for this visit. {Vitals History (Optional):23777}  Physical Exam   No results found for any visits on 06/02/23.      Assessment & Plan:   There are no diagnoses linked to this encounter.   No follow-ups on file.    Laneta Pintos, MD

## 2023-06-28 ENCOUNTER — Telehealth: Admitting: Physician Assistant

## 2023-06-28 ENCOUNTER — Encounter: Payer: Self-pay | Admitting: Physician Assistant

## 2023-06-28 DIAGNOSIS — M5441 Lumbago with sciatica, right side: Secondary | ICD-10-CM

## 2023-06-28 MED ORDER — AZITHROMYCIN 250 MG PO TABS: ORAL_TABLET | ORAL | 0 refills | Status: AC

## 2023-06-28 MED ORDER — BENZONATATE 100 MG PO CAPS: ORAL_CAPSULE | ORAL | 0 refills | Status: DC

## 2023-06-28 MED ORDER — METHYLPREDNISOLONE 4 MG PO TBPK: ORAL_TABLET | ORAL | 0 refills | Status: DC

## 2023-06-28 NOTE — Progress Notes (Signed)
 E-Visit for Cough     We are sorry that you are not feeling well.  Here is how we plan to help!   Based on your presentation I believe you most likely have A cough due to bacteria.  When patients have a fever and a productive cough with a change in color or increased sputum production, we are concerned about bacterial bronchitis.  If left untreated it can progress to pneumonia.  If your symptoms do not improve with your treatment plan it is important that you contact your provider.   I have prescribed Azithromyin 250 mg: two tablets now and then one tablet daily for 4 additonal days    In addition you may use A prescription cough medication called Tessalon  Perles 100mg . You may take 1-2 capsules every 8 hours as needed for your cough.   Along with a steroid taper Directions for 6 day taper: Day 1: 2 tablets before breakfast, 1 after both lunch & dinner and 2 at bedtime Day 2: 1 tab before breakfast, 1 after both lunch & dinner and 2 at bedtime Day 3: 1 tab at each meal & 1 at bedtime Day 4: 1 tab at breakfast, 1 at lunch, 1 at bedtime Day 5: 1 tab at breakfast & 1 tab at bedtime Day 6: 1 tab at breakfast   From your responses in the eVisit questionnaire you describe inflammation in the upper respiratory tract which is causing a significant cough.  This is commonly called Bronchitis and has four common causes:   Allergies Viral Infections Acid Reflux Bacterial Infection Allergies, viruses and acid reflux are treated by controlling symptoms or eliminating the cause. An example might be a cough caused by taking certain blood pressure medications. You stop the cough by changing the medication. Another example might be a cough caused by acid reflux. Controlling the reflux helps control the cough.   USE OF BRONCHODILATOR ("RESCUE") INHALERS: There is a risk from using your bronchodilator too frequently.  The risk is that over-reliance on a medication which only relaxes the muscles surrounding the  breathing tubes can reduce the effectiveness of medications prescribed to reduce swelling and congestion of the tubes themselves.  Although you feel brief relief from the bronchodilator inhaler, your asthma may actually be worsening with the tubes becoming more swollen and filled with mucus.  This can delay other crucial treatments, such as oral steroid medications. If you need to use a bronchodilator inhaler daily, several times per day, you should discuss this with your provider.  There are probably better treatments that could be used to keep your asthma under control.                HOME CARE Only take medications as instructed by your medical team. Complete the entire course of an antibiotic. Drink plenty of fluids and get plenty of rest. Avoid close contacts especially the very young and the elderly Cover your mouth if you cough or cough into your sleeve. Always remember to wash your hands A steam or ultrasonic humidifier can help congestion.    GET HELP RIGHT AWAY IF: You develop worsening fever. You become short of breath You cough up blood. Your symptoms persist after you have completed your treatment plan MAKE SURE YOU  Understand these instructions. Will watch your condition. Will get help right away if you are not doing well or get worse.     Thank you for choosing an e-visit.   Your e-visit answers were reviewed by a  board certified advanced clinical practitioner to complete your personal care plan. Depending upon the condition, your plan could have included both over the counter or prescription medications.   Please review your pharmacy choice. Make sure the pharmacy is open so you can pick up prescription now. If there is a problem, you may contact your provider through [employer] and have the prescription routed to another pharmacy.  Your safety is important to us . If you have drug allergies check your prescription carefully.    For the next 24 hours you can use  MyChart to ask questions about today's visit, request a non-urgent call back, or ask for a work or school excuse. You will get an email in the next two days asking about your experience. I hope that your e-visit has been valuable and will speed your recovery.     I have spent 5 minutes in review of e-visit questionnaire, review and updating patient chart, medical decision making and response to patient.   Etter Hermann Mayers, PA-C

## 2023-07-22 ENCOUNTER — Encounter: Payer: Self-pay | Admitting: Cardiology

## 2023-09-12 ENCOUNTER — Telehealth

## 2023-09-12 DIAGNOSIS — M545 Low back pain, unspecified: Secondary | ICD-10-CM

## 2023-09-12 DIAGNOSIS — M5441 Lumbago with sciatica, right side: Secondary | ICD-10-CM

## 2023-09-13 MED ORDER — NAPROXEN 500 MG PO TABS: 500.0000 mg | ORAL_TABLET | Freq: Two times a day (BID) | ORAL | 0 refills | Status: AC

## 2023-09-13 MED ORDER — CYCLOBENZAPRINE HCL 10 MG PO TABS: 5.0000 mg | ORAL_TABLET | Freq: Three times a day (TID) | ORAL | 0 refills | Status: AC | PRN

## 2023-09-13 NOTE — Progress Notes (Signed)

## 2023-09-13 NOTE — Progress Notes (Signed)
 I have spent 5 minutes in review of e-visit questionnaire, review and updating patient chart, medical decision making and response to patient.   Laure Kidney, PA-C

## 2023-09-14 ENCOUNTER — Telehealth

## 2023-09-14 DIAGNOSIS — M5441 Lumbago with sciatica, right side: Secondary | ICD-10-CM

## 2023-09-15 NOTE — Progress Notes (Signed)
  Because of ongoing symptoms despite treatment via e-visit and need for examination, I feel your condition warrants further evaluation and I recommend that you be seen in a face-to-face visit.   NOTE: There will be NO CHARGE for this E-Visit   If you are having a true medical emergency, please call 911.     For an urgent face to face visit, [facility] has multiple urgent care centers for your convenience.  Click the link below for the full list of locations and hours, walk-in wait times, appointment scheduling options and driving directions:  Urgent Care - [place], [place], [place], [place], [place], [place]  [place]     Your MyChart E-visit questionnaire answers were reviewed by a board certified advanced clinical practitioner to complete your personal care plan based on your specific symptoms.    Thank you for using e-Visits.

## 2023-10-09 ENCOUNTER — Other Ambulatory Visit: Payer: Self-pay | Admitting: Family Medicine

## 2023-10-09 DIAGNOSIS — F411 Generalized anxiety disorder: Secondary | ICD-10-CM

## 2023-11-11 ENCOUNTER — Encounter: Payer: 59 | Admitting: Family Medicine

## 2023-11-18 ENCOUNTER — Telehealth: Admitting: Physician Assistant

## 2023-11-18 DIAGNOSIS — B9689 Other specified bacterial agents as the cause of diseases classified elsewhere: Secondary | ICD-10-CM

## 2023-11-18 DIAGNOSIS — J019 Acute sinusitis, unspecified: Secondary | ICD-10-CM | POA: Diagnosis not present

## 2023-11-19 ENCOUNTER — Encounter: Payer: Self-pay | Admitting: Oncology

## 2023-11-19 ENCOUNTER — Other Ambulatory Visit: Payer: Self-pay

## 2023-11-19 MED ORDER — AMOXICILLIN-POT CLAVULANATE 875-125 MG PO TABS
1.0000 | ORAL_TABLET | Freq: Two times a day (BID) | ORAL | 0 refills | Status: DC
  Filled 2023-11-19: qty 14, 7d supply, fill #0

## 2023-11-19 MED ORDER — FLUTICASONE PROPIONATE 50 MCG/ACT NA SUSP
2.0000 | Freq: Every day | NASAL | 0 refills | Status: AC
  Filled 2023-11-19: qty 16, 30d supply, fill #0

## 2023-11-19 NOTE — Progress Notes (Signed)

## 2023-11-23 ENCOUNTER — Encounter: Payer: Self-pay | Admitting: Family Medicine

## 2023-12-07 ENCOUNTER — Telehealth: Admitting: Family Medicine

## 2023-12-07 DIAGNOSIS — R3989 Other symptoms and signs involving the genitourinary system: Secondary | ICD-10-CM | POA: Diagnosis not present

## 2023-12-07 MED ORDER — CEPHALEXIN 500 MG PO CAPS: 500.0000 mg | ORAL_CAPSULE | Freq: Two times a day (BID) | ORAL | 0 refills | Status: AC

## 2023-12-07 NOTE — Progress Notes (Signed)

## 2023-12-28 ENCOUNTER — Other Ambulatory Visit: Payer: Self-pay | Admitting: Cardiology

## 2023-12-28 ENCOUNTER — Other Ambulatory Visit: Payer: Self-pay

## 2023-12-30 ENCOUNTER — Other Ambulatory Visit: Payer: Self-pay

## 2023-12-30 MED FILL — Metoprolol Tartrate Tab 25 MG: ORAL | 30 days supply | Qty: 30 | Fill #0 | Status: AC

## 2024-01-07 ENCOUNTER — Encounter: Admitting: Family Medicine

## 2024-01-25 ENCOUNTER — Telehealth: Admitting: Family Medicine

## 2024-01-25 ENCOUNTER — Other Ambulatory Visit: Payer: Self-pay

## 2024-01-25 DIAGNOSIS — J019 Acute sinusitis, unspecified: Secondary | ICD-10-CM | POA: Diagnosis not present

## 2024-01-25 DIAGNOSIS — B9689 Other specified bacterial agents as the cause of diseases classified elsewhere: Secondary | ICD-10-CM | POA: Diagnosis not present

## 2024-01-25 MED ORDER — PREDNISONE 20 MG PO TABS
20.0000 mg | ORAL_TABLET | Freq: Two times a day (BID) | ORAL | 0 refills | Status: AC
  Filled 2024-01-25 (×2): qty 10, 5d supply, fill #0

## 2024-01-25 MED ORDER — AMOXICILLIN-POT CLAVULANATE 875-125 MG PO TABS
1.0000 | ORAL_TABLET | Freq: Two times a day (BID) | ORAL | 0 refills | Status: AC
  Filled 2024-01-25 (×2): qty 20, 10d supply, fill #0

## 2024-01-25 NOTE — Progress Notes (Signed)

## 2024-02-09 ENCOUNTER — Encounter: Admitting: Family Medicine

## 2024-03-02 ENCOUNTER — Telehealth: Admitting: Physician Assistant

## 2024-03-02 DIAGNOSIS — J9801 Acute bronchospasm: Secondary | ICD-10-CM

## 2024-03-02 DIAGNOSIS — J4521 Mild intermittent asthma with (acute) exacerbation: Secondary | ICD-10-CM | POA: Insufficient documentation

## 2024-03-02 MED ORDER — PREDNISONE 20 MG PO TABS: 40.0000 mg | ORAL_TABLET | Freq: Every day | ORAL | 0 refills | Status: AC

## 2024-03-02 MED ORDER — ALBUTEROL SULFATE HFA 108 (90 BASE) MCG/ACT IN AERS: 1.0000 | INHALATION_SPRAY | Freq: Four times a day (QID) | RESPIRATORY_TRACT | 0 refills | Status: AC | PRN

## 2024-03-02 NOTE — Progress Notes (Signed)
 E Visit for Asthma  Based on what you have shared with me, it looks like you may have a flare up of your asthma.  Asthma is a chronic (ongoing) lung disease which results in airway obstruction, inflammation and hyper-responsiveness.   Asthma symptoms vary from person to person, with common symptoms including nighttime awakening and decreased ability to participate in normal activities due to shortness of breath. It is often triggered by changes in weather, changes in the season, changes in air temperature, or inside (home, school, daycare or work) allergens such as animal dander, mold, mildew, woodstoves or cockroaches.   It can also be triggered by hormonal changes, extreme emotion, physical exertion or an upper respiratory tract illness.     It is important to identify the trigger and eliminate or avoid the trigger if possible.   If you have been prescribed medications to be taken on a regular basis, it is important to follow the asthma action plan and to follow guidelines to adjust medication in response to increasing symptoms of decreased peak expiratory flow rate.  Treatment: I have prescribed: Albuterol  (Proventil  HFA; Ventolin  HFA) 108 (90 Base) MCG/ACT Inhaler 2 puffs into the lungs every six hours as needed for wheezing or shortness of breath and Prednisone  40mg  by mouth per day for 5 days  HOME CARE Only take medications as instructed by your medical team. Consider wearing a mask or scarf to improve breathing when there is poor air quality as this has been shown to decrease irritation and decrease exacerbations Get rest. Using a humidifier may help nasal congestion and ease sore throat pain. Using a saline nasal spray works much the same way.  Cough drops, hard candies and sore throat lozenges may ease your cough.  Avoid close contacts, especially the very you and the elderly. Cover your mouth if you cough or sneeze. Always remember to wash your hands.   GET HELP RIGHT AWAY  IF: You develop worsening shortness of breath/difficulty breathing or chest tightness, breathlessness at rest, drowsy, confused or agitated, unable to speak in full sentences, or if you develop chest pain.  You have coughing fits. You develop a severe headache or visual changes. You develop shortness of breath, difficulty breathing or start having chest pain. Your symptoms persist after you have completed your treatment plan. If your symptoms do not improve within 5 days.  MAKE SURE YOU Understand these instructions. Will watch your condition. Will get help right away if you are not doing well or get worse.   Your e-visit answers were reviewed by a board certified advanced clinical practitioner to complete your personal care plan, Depending upon the condition, your plan could have included both over the counter or prescription medications.  Please review your pharmacy choice. Your safety is important to us . If you have drug allergies check your prescription carefully. You can use MyChart to ask questions about today's visit, request a non-urgent call back, or ask for a work or school excuse for 24 hours related to this e-Visit. If it has been greater than 24 hours you will need to follow up with your provider, or enter a new e-Visit to address those concerns.  You will get an e-mail in the next two days asking about your experience. I hope that your e-visit has been valuable and will speed your recovery. Thank you for using e-visits.  I have spent 5 minutes in review of e-visit questionnaire, review and updating patient chart, medical decision making and response to patient.  Delon CHRISTELLA Dickinson, PA-C

## 2024-03-03 ENCOUNTER — Other Ambulatory Visit: Payer: Self-pay

## 2024-03-03 MED ORDER — ALBUTEROL SULFATE HFA 108 (90 BASE) MCG/ACT IN AERS
2.0000 | INHALATION_SPRAY | Freq: Four times a day (QID) | RESPIRATORY_TRACT | 0 refills | Status: AC | PRN
  Filled 2024-03-03: qty 8.5, 25d supply, fill #0

## 2024-03-03 MED ORDER — PREDNISONE 20 MG PO TABS
40.0000 mg | ORAL_TABLET | Freq: Every day | ORAL | 0 refills | Status: AC
  Filled 2024-03-03: qty 10, 5d supply, fill #0
